# Patient Record
Sex: Female | Born: 1935 | Race: White | Hispanic: No | Smoking: Former smoker
Health system: Southern US, Community
[De-identification: ages and names within clinical notes are randomized; demographics above are authoritative.]

## PROBLEM LIST (undated history)

## (undated) DIAGNOSIS — I10 Essential (primary) hypertension: Secondary | ICD-10-CM

## (undated) DIAGNOSIS — E538 Deficiency of other specified B group vitamins: Secondary | ICD-10-CM

## (undated) DIAGNOSIS — J449 Chronic obstructive pulmonary disease, unspecified: Secondary | ICD-10-CM

## (undated) DIAGNOSIS — R7303 Prediabetes: Secondary | ICD-10-CM

## (undated) HISTORY — PX: ANKLE FRACTURE SURGERY: SHX122

---

## 2018-02-18 ENCOUNTER — Inpatient Hospital Stay (HOSPITAL_COMMUNITY): Payer: Medicare Other

## 2018-02-18 ENCOUNTER — Emergency Department (HOSPITAL_COMMUNITY): Payer: Medicare Other

## 2018-02-18 ENCOUNTER — Inpatient Hospital Stay (HOSPITAL_COMMUNITY)
Admission: EM | Admit: 2018-02-18 | Discharge: 2018-02-25 | DRG: 493 | Disposition: A | Discharge status: Skilled Nursing Facility | Payer: Medicare Other | Attending: Internal Medicine | Admitting: Internal Medicine

## 2018-02-18 ENCOUNTER — Inpatient Hospital Stay (HOSPITAL_COMMUNITY): Payer: Medicare Other | Admitting: Anesthesiology

## 2018-02-18 ENCOUNTER — Encounter (HOSPITAL_COMMUNITY)
Admission: EM | Disposition: A | Discharge status: Skilled Nursing Facility | Payer: Self-pay | Source: Home / Self Care | Attending: Internal Medicine

## 2018-02-18 ENCOUNTER — Encounter (HOSPITAL_COMMUNITY): Payer: Self-pay | Admitting: Emergency Medicine

## 2018-02-18 DIAGNOSIS — Y92018 Other place in single-family (private) house as the place of occurrence of the external cause: Secondary | ICD-10-CM | POA: Diagnosis not present

## 2018-02-18 DIAGNOSIS — T148XXA Other injury of unspecified body region, initial encounter: Secondary | ICD-10-CM

## 2018-02-18 DIAGNOSIS — Z7982 Long term (current) use of aspirin: Secondary | ICD-10-CM

## 2018-02-18 DIAGNOSIS — L03113 Cellulitis of right upper limb: Secondary | ICD-10-CM | POA: Diagnosis present

## 2018-02-18 DIAGNOSIS — E538 Deficiency of other specified B group vitamins: Secondary | ICD-10-CM | POA: Diagnosis present

## 2018-02-18 DIAGNOSIS — Z888 Allergy status to other drugs, medicaments and biological substances status: Secondary | ICD-10-CM

## 2018-02-18 DIAGNOSIS — Z7951 Long term (current) use of inhaled steroids: Secondary | ICD-10-CM

## 2018-02-18 DIAGNOSIS — S82892A Other fracture of left lower leg, initial encounter for closed fracture: Secondary | ICD-10-CM

## 2018-02-18 DIAGNOSIS — S82892B Other fracture of left lower leg, initial encounter for open fracture type I or II: Secondary | ICD-10-CM | POA: Diagnosis not present

## 2018-02-18 DIAGNOSIS — Z87891 Personal history of nicotine dependence: Secondary | ICD-10-CM | POA: Diagnosis not present

## 2018-02-18 DIAGNOSIS — W19XXXA Unspecified fall, initial encounter: Secondary | ICD-10-CM

## 2018-02-18 DIAGNOSIS — S82899A Other fracture of unspecified lower leg, initial encounter for closed fracture: Secondary | ICD-10-CM | POA: Diagnosis present

## 2018-02-18 DIAGNOSIS — F419 Anxiety disorder, unspecified: Secondary | ICD-10-CM | POA: Diagnosis present

## 2018-02-18 DIAGNOSIS — I129 Hypertensive chronic kidney disease with stage 1 through stage 4 chronic kidney disease, or unspecified chronic kidney disease: Secondary | ICD-10-CM | POA: Diagnosis present

## 2018-02-18 DIAGNOSIS — W5501XA Bitten by cat, initial encounter: Secondary | ICD-10-CM | POA: Diagnosis not present

## 2018-02-18 DIAGNOSIS — S82892E Other fracture of left lower leg, subsequent encounter for open fracture type I or II with routine healing: Secondary | ICD-10-CM | POA: Diagnosis present

## 2018-02-18 DIAGNOSIS — N189 Chronic kidney disease, unspecified: Secondary | ICD-10-CM | POA: Diagnosis present

## 2018-02-18 DIAGNOSIS — D631 Anemia in chronic kidney disease: Secondary | ICD-10-CM | POA: Diagnosis present

## 2018-02-18 DIAGNOSIS — W109XXA Fall (on) (from) unspecified stairs and steps, initial encounter: Secondary | ICD-10-CM | POA: Diagnosis present

## 2018-02-18 DIAGNOSIS — L0390 Cellulitis, unspecified: Secondary | ICD-10-CM | POA: Diagnosis present

## 2018-02-18 DIAGNOSIS — R7303 Prediabetes: Secondary | ICD-10-CM | POA: Diagnosis present

## 2018-02-18 DIAGNOSIS — Z9981 Dependence on supplemental oxygen: Secondary | ICD-10-CM | POA: Diagnosis not present

## 2018-02-18 DIAGNOSIS — Z91048 Other nonmedicinal substance allergy status: Secondary | ICD-10-CM

## 2018-02-18 DIAGNOSIS — S82252B Displaced comminuted fracture of shaft of left tibia, initial encounter for open fracture type I or II: Principal | ICD-10-CM | POA: Diagnosis present

## 2018-02-18 DIAGNOSIS — J449 Chronic obstructive pulmonary disease, unspecified: Secondary | ICD-10-CM | POA: Diagnosis present

## 2018-02-18 DIAGNOSIS — I10 Essential (primary) hypertension: Secondary | ICD-10-CM | POA: Diagnosis present

## 2018-02-18 DIAGNOSIS — E785 Hyperlipidemia, unspecified: Secondary | ICD-10-CM | POA: Diagnosis present

## 2018-02-18 DIAGNOSIS — N19 Unspecified kidney failure: Secondary | ICD-10-CM | POA: Diagnosis present

## 2018-02-18 DIAGNOSIS — S82209A Unspecified fracture of shaft of unspecified tibia, initial encounter for closed fracture: Secondary | ICD-10-CM | POA: Diagnosis present

## 2018-02-18 DIAGNOSIS — A280 Pasteurellosis: Secondary | ICD-10-CM | POA: Diagnosis present

## 2018-02-18 DIAGNOSIS — L03011 Cellulitis of right finger: Secondary | ICD-10-CM

## 2018-02-18 HISTORY — DX: Chronic obstructive pulmonary disease, unspecified: J44.9

## 2018-02-18 HISTORY — DX: Essential (primary) hypertension: I10

## 2018-02-18 HISTORY — PX: EXTERNAL FIXATION LEG: SHX1549

## 2018-02-18 HISTORY — PX: APPLICATION OF WOUND VAC: SHX5189

## 2018-02-18 HISTORY — PX: INCISION AND DRAINAGE OF WOUND: SHX1803

## 2018-02-18 HISTORY — DX: Prediabetes: R73.03

## 2018-02-18 HISTORY — PX: I & D EXTREMITY: SHX5045

## 2018-02-18 HISTORY — DX: Deficiency of other specified B group vitamins: E53.8

## 2018-02-18 LAB — CBC WITH DIFFERENTIAL/PLATELET
Basophils Absolute: 0 10*3/uL (ref 0.0–0.1)
Basophils Relative: 0 %
EOS PCT: 0 %
Eosinophils Absolute: 0 10*3/uL (ref 0.0–0.7)
HCT: 36 % (ref 36.0–46.0)
Hemoglobin: 11.5 g/dL — ABNORMAL LOW (ref 12.0–15.0)
LYMPHS ABS: 1.3 10*3/uL (ref 0.7–4.0)
Lymphocytes Relative: 5 %
MCH: 30 pg (ref 26.0–34.0)
MCHC: 31.9 g/dL (ref 30.0–36.0)
MCV: 94 fL (ref 78.0–100.0)
MONO ABS: 2.1 10*3/uL — AB (ref 0.1–1.0)
MONOS PCT: 8 %
Neutro Abs: 22.7 10*3/uL — ABNORMAL HIGH (ref 1.7–7.7)
Neutrophils Relative %: 87 %
PLATELETS: 143 10*3/uL — AB (ref 150–400)
RBC: 3.83 MIL/uL — AB (ref 3.87–5.11)
RDW: 13.9 % (ref 11.5–15.5)
WBC: 26.1 10*3/uL — AB (ref 4.0–10.5)

## 2018-02-18 LAB — BASIC METABOLIC PANEL
ANION GAP: 10 (ref 5–15)
BUN: 40 mg/dL — AB (ref 8–23)
CALCIUM: 8.8 mg/dL — AB (ref 8.9–10.3)
CO2: 25 mmol/L (ref 22–32)
CREATININE: 1.72 mg/dL — AB (ref 0.44–1.00)
Chloride: 107 mmol/L (ref 98–111)
GFR calc non Af Amer: 26 mL/min — ABNORMAL LOW (ref >60)
GFR, EST AFRICAN AMERICAN: 30 mL/min — AB (ref >60)
Glucose, Bld: 121 mg/dL — ABNORMAL HIGH (ref 70–99)
Potassium: 4 mmol/L (ref 3.5–5.1)
Sodium: 142 mmol/L (ref 135–145)

## 2018-02-18 LAB — GLUCOSE, CAPILLARY: GLUCOSE-CAPILLARY: 152 mg/dL — AB (ref 70–99)

## 2018-02-18 SURGERY — IRRIGATION AND DEBRIDEMENT EXTREMITY
Anesthesia: General | Site: Hand | Laterality: Right

## 2018-02-18 MED ORDER — DOCUSATE SODIUM 100 MG PO CAPS: 100.0000 mg | ORAL_CAPSULE | Freq: Two times a day (BID) | ORAL | Status: DC

## 2018-02-18 MED ORDER — SODIUM CHLORIDE 0.9 % IV SOLN
3.0000 g | INTRAVENOUS | Status: AC
  Administered 2018-02-18: 3 g via INTRAVENOUS
  Filled 2018-02-18: qty 3

## 2018-02-18 MED ORDER — ENOXAPARIN SODIUM 40 MG/0.4ML ~~LOC~~ SOLN: 40.0000 mg | SUBCUTANEOUS | Status: DC

## 2018-02-18 MED ORDER — PANTOPRAZOLE SODIUM 40 MG PO TBEC
40.0000 mg | DELAYED_RELEASE_TABLET | Freq: Every day | ORAL | Status: DC
  Administered 2018-02-19 – 2018-02-25 (×6): 40 mg via ORAL
  Filled 2018-02-18 (×6): qty 1

## 2018-02-18 MED ORDER — ROCURONIUM BROMIDE 50 MG/5ML IV SOSY
PREFILLED_SYRINGE | INTRAVENOUS | Status: AC
  Filled 2018-02-18: qty 5

## 2018-02-18 MED ORDER — CHLORHEXIDINE GLUCONATE 4 % EX LIQD
60.0000 mL | Freq: Once | CUTANEOUS | Status: DC
  Filled 2018-02-18: qty 60

## 2018-02-18 MED ORDER — DEXAMETHASONE SODIUM PHOSPHATE 10 MG/ML IJ SOLN
INTRAMUSCULAR | Status: AC
  Filled 2018-02-18: qty 1

## 2018-02-18 MED ORDER — METOCLOPRAMIDE HCL 5 MG PO TABS: 5.0000 mg | ORAL_TABLET | Freq: Three times a day (TID) | ORAL | Status: DC | PRN

## 2018-02-18 MED ORDER — DOCUSATE SODIUM 100 MG PO CAPS
100.0000 mg | ORAL_CAPSULE | Freq: Two times a day (BID) | ORAL | Status: DC
  Administered 2018-02-18 – 2018-02-25 (×13): 100 mg via ORAL
  Filled 2018-02-18 (×13): qty 1

## 2018-02-18 MED ORDER — ALPRAZOLAM 0.5 MG PO TABS
0.5000 mg | ORAL_TABLET | Freq: Three times a day (TID) | ORAL | Status: DC | PRN
  Administered 2018-02-20: 0.5 mg via ORAL
  Filled 2018-02-18: qty 2

## 2018-02-18 MED ORDER — PROPOFOL 10 MG/ML IV BOLUS
INTRAVENOUS | Status: DC | PRN
  Administered 2018-02-18: 100 mg via INTRAVENOUS

## 2018-02-18 MED ORDER — METOCLOPRAMIDE HCL 5 MG/ML IJ SOLN: 5.0000 mg | Freq: Three times a day (TID) | INTRAMUSCULAR | Status: DC | PRN

## 2018-02-18 MED ORDER — HEPARIN SODIUM (PORCINE) 5000 UNIT/ML IJ SOLN
5000.0000 [IU] | Freq: Three times a day (TID) | INTRAMUSCULAR | Status: DC
  Administered 2018-02-19: 5000 [IU] via SUBCUTANEOUS
  Filled 2018-02-18: qty 1

## 2018-02-18 MED ORDER — ROCURONIUM BROMIDE 50 MG/5ML IV SOSY
PREFILLED_SYRINGE | INTRAVENOUS | Status: DC | PRN
  Administered 2018-02-18: 10 mg via INTRAVENOUS
  Administered 2018-02-18: 40 mg via INTRAVENOUS

## 2018-02-18 MED ORDER — FENTANYL CITRATE (PF) 100 MCG/2ML IJ SOLN
INTRAMUSCULAR | Status: AC
  Administered 2018-02-18: 25 ug via INTRAVENOUS
  Filled 2018-02-18: qty 2

## 2018-02-18 MED ORDER — INSULIN ASPART 100 UNIT/ML ~~LOC~~ SOLN
0.0000 [IU] | Freq: Three times a day (TID) | SUBCUTANEOUS | Status: DC
  Administered 2018-02-19 (×2): 1 [IU] via SUBCUTANEOUS
  Administered 2018-02-19: 2 [IU] via SUBCUTANEOUS
  Administered 2018-02-20: 3 [IU] via SUBCUTANEOUS

## 2018-02-18 MED ORDER — PROPOFOL 10 MG/ML IV BOLUS
10.0000 mg | Freq: Once | INTRAVENOUS | Status: AC
  Administered 2018-02-18: 40 mg via INTRAVENOUS
  Filled 2018-02-18: qty 20

## 2018-02-18 MED ORDER — HYDROCODONE-ACETAMINOPHEN 5-325 MG PO TABS
1.0000 | ORAL_TABLET | Freq: Four times a day (QID) | ORAL | Status: DC | PRN
  Administered 2018-02-19 (×3): 1 via ORAL
  Filled 2018-02-18 (×3): qty 1

## 2018-02-18 MED ORDER — ALBUTEROL SULFATE (2.5 MG/3ML) 0.083% IN NEBU: 2.5000 mg | INHALATION_SOLUTION | RESPIRATORY_TRACT | Status: DC | PRN

## 2018-02-18 MED ORDER — EPHEDRINE SULFATE-NACL 50-0.9 MG/10ML-% IV SOSY
PREFILLED_SYRINGE | INTRAVENOUS | Status: DC | PRN
  Administered 2018-02-18: 5 mg via INTRAVENOUS
  Administered 2018-02-18: 10 mg via INTRAVENOUS

## 2018-02-18 MED ORDER — FLUTICASONE FUROATE-VILANTEROL 100-25 MCG/INH IN AEPB
1.0000 | INHALATION_SPRAY | Freq: Every day | RESPIRATORY_TRACT | Status: DC
  Administered 2018-02-19 – 2018-02-25 (×6): 1 via RESPIRATORY_TRACT
  Filled 2018-02-18: qty 28

## 2018-02-18 MED ORDER — EPHEDRINE 5 MG/ML INJ
INTRAVENOUS | Status: AC
  Filled 2018-02-18: qty 10

## 2018-02-18 MED ORDER — FENTANYL CITRATE (PF) 250 MCG/5ML IJ SOLN
INTRAMUSCULAR | Status: AC
  Filled 2018-02-18: qty 5

## 2018-02-18 MED ORDER — SODIUM CHLORIDE 0.9 % IV SOLN
1.5000 g | Freq: Three times a day (TID) | INTRAVENOUS | Status: DC
  Administered 2018-02-19 – 2018-02-22 (×10): 1.5 g via INTRAVENOUS
  Filled 2018-02-18 (×11): qty 1.5

## 2018-02-18 MED ORDER — PROMETHAZINE HCL 25 MG/ML IJ SOLN: 6.2500 mg | INTRAMUSCULAR | Status: DC | PRN

## 2018-02-18 MED ORDER — SODIUM CHLORIDE 0.9 % IV SOLN
INTRAVENOUS | Status: DC | PRN
  Administered 2018-02-18: 40 ug/min via INTRAVENOUS

## 2018-02-18 MED ORDER — POVIDONE-IODINE 10 % EX SWAB: 2.0000 "application " | Freq: Once | CUTANEOUS | Status: DC

## 2018-02-18 MED ORDER — UMECLIDINIUM BROMIDE 62.5 MCG/INH IN AEPB
1.0000 | INHALATION_SPRAY | Freq: Every day | RESPIRATORY_TRACT | Status: DC
  Administered 2018-02-19 – 2018-02-25 (×6): 1 via RESPIRATORY_TRACT
  Filled 2018-02-18: qty 7

## 2018-02-18 MED ORDER — MORPHINE SULFATE (PF) 2 MG/ML IV SOLN: 0.5000 mg | INTRAVENOUS | Status: DC | PRN

## 2018-02-18 MED ORDER — PHENYLEPHRINE 40 MCG/ML (10ML) SYRINGE FOR IV PUSH (FOR BLOOD PRESSURE SUPPORT)
PREFILLED_SYRINGE | INTRAVENOUS | Status: AC
  Filled 2018-02-18: qty 10

## 2018-02-18 MED ORDER — SUGAMMADEX SODIUM 200 MG/2ML IV SOLN
INTRAVENOUS | Status: DC | PRN
  Administered 2018-02-18: 200 mg via INTRAVENOUS

## 2018-02-18 MED ORDER — ASPIRIN EC 81 MG PO TBEC: 81.0000 mg | DELAYED_RELEASE_TABLET | Freq: Every day | ORAL | Status: DC

## 2018-02-18 MED ORDER — DEXAMETHASONE SODIUM PHOSPHATE 10 MG/ML IJ SOLN
INTRAMUSCULAR | Status: DC | PRN
  Administered 2018-02-18: 5 mg via INTRAVENOUS

## 2018-02-18 MED ORDER — VANCOMYCIN HCL 500 MG IV SOLR
INTRAVENOUS | Status: AC
  Filled 2018-02-18: qty 500

## 2018-02-18 MED ORDER — ONDANSETRON HCL 4 MG/2ML IJ SOLN
INTRAMUSCULAR | Status: DC | PRN
  Administered 2018-02-18: 4 mg via INTRAVENOUS

## 2018-02-18 MED ORDER — ONDANSETRON HCL 4 MG/2ML IJ SOLN
INTRAMUSCULAR | Status: AC
  Filled 2018-02-18: qty 2

## 2018-02-18 MED ORDER — SODIUM CHLORIDE 0.9 % IV SOLN
Freq: Once | INTRAVENOUS | Status: AC
  Administered 2018-02-18: 20:00:00 via INTRAVENOUS

## 2018-02-18 MED ORDER — METHOCARBAMOL 1000 MG/10ML IJ SOLN
500.0000 mg | Freq: Four times a day (QID) | INTRAVENOUS | Status: DC | PRN
  Filled 2018-02-18: qty 5

## 2018-02-18 MED ORDER — METOPROLOL SUCCINATE ER 50 MG PO TB24
50.0000 mg | ORAL_TABLET | Freq: Every day | ORAL | Status: DC
  Administered 2018-02-19 – 2018-02-25 (×6): 50 mg via ORAL
  Filled 2018-02-18 (×7): qty 1

## 2018-02-18 MED ORDER — FENTANYL CITRATE (PF) 100 MCG/2ML IJ SOLN
50.0000 ug | INTRAMUSCULAR | Status: DC | PRN
  Administered 2018-02-18: 50 ug via INTRAVENOUS
  Administered 2018-02-18: 25 ug via INTRAVENOUS
  Administered 2018-02-18: 50 ug via INTRAVENOUS
  Administered 2018-02-18: 25 ug via INTRAVENOUS
  Administered 2018-02-18: 50 ug via INTRAVENOUS
  Administered 2018-02-18: 100 ug via INTRAVENOUS
  Filled 2018-02-18 (×4): qty 2

## 2018-02-18 MED ORDER — ONDANSETRON HCL 4 MG/2ML IJ SOLN
4.0000 mg | Freq: Four times a day (QID) | INTRAMUSCULAR | Status: DC | PRN
  Administered 2018-02-20 – 2018-02-22 (×2): 4 mg via INTRAVENOUS
  Filled 2018-02-18 (×2): qty 2

## 2018-02-18 MED ORDER — FENTANYL CITRATE (PF) 100 MCG/2ML IJ SOLN
25.0000 ug | INTRAMUSCULAR | Status: DC | PRN
  Administered 2018-02-18: 25 ug via INTRAVENOUS
  Administered 2018-02-18: 50 ug via INTRAVENOUS
  Administered 2018-02-18: 25 ug via INTRAVENOUS

## 2018-02-18 MED ORDER — LACTATED RINGERS IV SOLN
INTRAVENOUS | Status: DC
  Administered 2018-02-18 – 2018-02-20 (×2): via INTRAVENOUS

## 2018-02-18 MED ORDER — VANCOMYCIN HCL 500 MG IV SOLR
INTRAVENOUS | Status: DC | PRN
  Administered 2018-02-18: 500 mg via TOPICAL

## 2018-02-18 MED ORDER — LIDOCAINE 2% (20 MG/ML) 5 ML SYRINGE
INTRAMUSCULAR | Status: AC
  Filled 2018-02-18: qty 5

## 2018-02-18 MED ORDER — CLONIDINE HCL 0.2 MG PO TABS
0.2000 mg | ORAL_TABLET | Freq: Two times a day (BID) | ORAL | Status: DC
  Administered 2018-02-18 – 2018-02-25 (×12): 0.2 mg via ORAL
  Filled 2018-02-18 (×13): qty 1

## 2018-02-18 MED ORDER — LIDOCAINE 2% (20 MG/ML) 5 ML SYRINGE
INTRAMUSCULAR | Status: DC | PRN
  Administered 2018-02-18: 100 mg via INTRAVENOUS

## 2018-02-18 MED ORDER — SODIUM CHLORIDE 0.9 % IR SOLN
Status: DC | PRN
  Administered 2018-02-18 (×4): 1000 mL
  Administered 2018-02-18: 3000 mL

## 2018-02-18 MED ORDER — ONDANSETRON HCL 4 MG PO TABS: 4.0000 mg | ORAL_TABLET | Freq: Four times a day (QID) | ORAL | Status: DC | PRN

## 2018-02-18 MED ORDER — PROPOFOL 10 MG/ML IV BOLUS
INTRAVENOUS | Status: AC
  Filled 2018-02-18: qty 20

## 2018-02-18 MED ORDER — AMLODIPINE BESYLATE 10 MG PO TABS
10.0000 mg | ORAL_TABLET | Freq: Every day | ORAL | Status: DC
  Administered 2018-02-18 – 2018-02-25 (×6): 10 mg via ORAL
  Filled 2018-02-18 (×3): qty 1
  Filled 2018-02-18: qty 2
  Filled 2018-02-18 (×2): qty 1
  Filled 2018-02-18: qty 2

## 2018-02-18 MED ORDER — PHENYLEPHRINE 40 MCG/ML (10ML) SYRINGE FOR IV PUSH (FOR BLOOD PRESSURE SUPPORT)
PREFILLED_SYRINGE | INTRAVENOUS | Status: DC | PRN
  Administered 2018-02-18: 40 ug via INTRAVENOUS

## 2018-02-18 MED ORDER — CEFAZOLIN SODIUM-DEXTROSE 1-4 GM/50ML-% IV SOLN
1.0000 g | Freq: Once | INTRAVENOUS | Status: AC
  Administered 2018-02-18: 1 g via INTRAVENOUS
  Filled 2018-02-18 (×2): qty 50

## 2018-02-18 MED ORDER — MEPERIDINE HCL 50 MG/ML IJ SOLN: 6.2500 mg | INTRAMUSCULAR | Status: DC | PRN

## 2018-02-18 MED ORDER — FLUTICASONE-UMECLIDIN-VILANT 100-62.5-25 MCG/INH IN AEPB: 1.0000 | INHALATION_SPRAY | Freq: Every day | RESPIRATORY_TRACT | Status: DC

## 2018-02-18 MED ORDER — METHOCARBAMOL 500 MG PO TABS
500.0000 mg | ORAL_TABLET | Freq: Four times a day (QID) | ORAL | Status: DC | PRN
  Administered 2018-02-19 – 2018-02-20 (×2): 500 mg via ORAL
  Filled 2018-02-18: qty 1

## 2018-02-18 SURGICAL SUPPLY — 83 items
ALCOHOL 70% 16 OZ (MISCELLANEOUS) ×4 IMPLANT
BANDAGE ACE 4X5 VEL STRL LF (GAUZE/BANDAGES/DRESSINGS) ×8 IMPLANT
BANDAGE ACE 6X5 VEL STRL LF (GAUZE/BANDAGES/DRESSINGS) ×4 IMPLANT
BANDAGE ESMARK 6X9 LF (GAUZE/BANDAGES/DRESSINGS) ×2 IMPLANT
BAR GLASS FIBER EXFX 11X300 (EXFIX) ×4 IMPLANT
BNDG COHESIVE 4X5 TAN STRL (GAUZE/BANDAGES/DRESSINGS) IMPLANT
BNDG COHESIVE 6X5 TAN STRL LF (GAUZE/BANDAGES/DRESSINGS) ×4 IMPLANT
BNDG ESMARK 6X9 LF (GAUZE/BANDAGES/DRESSINGS) ×4
BNDG GAUZE ELAST 4 BULKY (GAUZE/BANDAGES/DRESSINGS) ×4 IMPLANT
BNDG STRETCH 4X75 NS LF (GAUZE/BANDAGES/DRESSINGS) ×4 IMPLANT
CANISTER WOUND CARE 500ML ATS (WOUND CARE) ×4 IMPLANT
CAP PROTECTIVE TRANSFX 4.5X5MM (EXFIX) ×16 IMPLANT
CLAMP BLUE BAR TO PIN (EXFIX) ×8 IMPLANT
CLEANER TIP ELECTROSURG 2X2 (MISCELLANEOUS) ×4 IMPLANT
CLOSURE WOUND 1/2 X4 (GAUZE/BANDAGES/DRESSINGS)
COVER SURGICAL LIGHT HANDLE (MISCELLANEOUS) ×4 IMPLANT
DRAIN PENROSE 1/4X12 LTX STRL (WOUND CARE) ×4 IMPLANT
DRAPE C-ARM 42X72 X-RAY (DRAPES) IMPLANT
DRAPE C-ARMOR (DRAPES) ×4 IMPLANT
DRAPE EXTREMITY T 121X128X90 (DRAPE) ×4 IMPLANT
DRAPE IMP U-DRAPE 54X76 (DRAPES) ×4 IMPLANT
DRAPE LAPAROTOMY TRNSV 102X78 (DRAPE) ×4 IMPLANT
DRAPE OEC MINIVIEW 54X84 (DRAPES) ×4 IMPLANT
DRAPE U-SHAPE 47X51 STRL (DRAPES) ×4 IMPLANT
DRSG ADAPTIC 3X8 NADH LF (GAUZE/BANDAGES/DRESSINGS) ×4 IMPLANT
DRSG PAD ABDOMINAL 8X10 ST (GAUZE/BANDAGES/DRESSINGS) IMPLANT
DURAPREP 26ML APPLICATOR (WOUND CARE) ×4 IMPLANT
ELECT REM PT RETURN 9FT ADLT (ELECTROSURGICAL) ×4
ELECTRODE REM PT RTRN 9FT ADLT (ELECTROSURGICAL) ×2 IMPLANT
EVACUATOR 1/8 PVC DRAIN (DRAIN) IMPLANT
GAUZE SPONGE 4X4 12PLY STRL (GAUZE/BANDAGES/DRESSINGS) ×4 IMPLANT
GAUZE SPONGE 4X4 12PLY STRL LF (GAUZE/BANDAGES/DRESSINGS) ×8 IMPLANT
GAUZE XEROFORM 5X9 LF (GAUZE/BANDAGES/DRESSINGS) ×8 IMPLANT
GLOVE BIOGEL PI IND STRL 7.5 (GLOVE) ×2 IMPLANT
GLOVE BIOGEL PI IND STRL 8 (GLOVE) ×2 IMPLANT
GLOVE BIOGEL PI INDICATOR 7.5 (GLOVE) ×2
GLOVE BIOGEL PI INDICATOR 8 (GLOVE) ×2
GLOVE ECLIPSE 7.0 STRL STRAW (GLOVE) ×4 IMPLANT
GLOVE SURG ORTHO 8.0 STRL STRW (GLOVE) ×4 IMPLANT
GOWN STRL REUS W/ TWL LRG LVL3 (GOWN DISPOSABLE) ×6 IMPLANT
GOWN STRL REUS W/ TWL XL LVL3 (GOWN DISPOSABLE) ×2 IMPLANT
GOWN STRL REUS W/TWL LRG LVL3 (GOWN DISPOSABLE) ×6
GOWN STRL REUS W/TWL XL LVL3 (GOWN DISPOSABLE) ×2
HANDPIECE INTERPULSE COAX TIP (DISPOSABLE)
KIT BASIN OR (CUSTOM PROCEDURE TRAY) ×4 IMPLANT
KIT PREVENA INCISION MGT 13 (CANNISTER) ×4 IMPLANT
KIT TURNOVER KIT B (KITS) ×4 IMPLANT
MANIFOLD NEPTUNE II (INSTRUMENTS) ×4 IMPLANT
NEEDLE 22X1 1/2 (OR ONLY) (NEEDLE) IMPLANT
NS IRRIG 1000ML POUR BTL (IV SOLUTION) ×4 IMPLANT
PACK ORTHO EXTREMITY (CUSTOM PROCEDURE TRAY) ×4 IMPLANT
PAD ARMBOARD 7.5X6 YLW CONV (MISCELLANEOUS) ×4 IMPLANT
PAD CAST 4YDX4 CTTN HI CHSV (CAST SUPPLIES) IMPLANT
PADDING CAST COTTON 4X4 STRL (CAST SUPPLIES)
PADDING CAST COTTON 6X4 STRL (CAST SUPPLIES) ×12 IMPLANT
PIN CLAMP 2BAR 75MM BLUE (EXFIX) ×4 IMPLANT
PIN HALF YELLOW 5X160X35 (EXFIX) ×8 IMPLANT
PIN TRANSFIXING 5.0 (EXFIX) ×4 IMPLANT
SET HNDPC FAN SPRY TIP SCT (DISPOSABLE) IMPLANT
SPONGE LAP 18X18 X RAY DECT (DISPOSABLE) ×4 IMPLANT
SPONGE LAP 4X18 RFD (DISPOSABLE) IMPLANT
STAPLER VISISTAT 35W (STAPLE) IMPLANT
STOCKINETTE IMPERVIOUS 9X36 MD (GAUZE/BANDAGES/DRESSINGS) IMPLANT
STOCKINETTE IMPERVIOUS LG (DRAPES) ×4 IMPLANT
STRIP CLOSURE SKIN 1/2X4 (GAUZE/BANDAGES/DRESSINGS) IMPLANT
SUCTION FRAZIER HANDLE 10FR (MISCELLANEOUS)
SUCTION TUBE FRAZIER 10FR DISP (MISCELLANEOUS) IMPLANT
SUT ETHILON 2 0 FS 18 (SUTURE) ×12 IMPLANT
SUT ETHILON 3 0 PS 1 (SUTURE) ×12 IMPLANT
SUT VIC AB 0 CT1 27 (SUTURE) ×4
SUT VIC AB 0 CT1 27XBRD ANBCTR (SUTURE) ×4 IMPLANT
SUT VIC AB 2-0 CT1 27 (SUTURE) ×4
SUT VIC AB 2-0 CT1 TAPERPNT 27 (SUTURE) ×4 IMPLANT
SWAB CULTURE ESWAB REG 1ML (MISCELLANEOUS) IMPLANT
SWAB CULTURE LIQ STUART DBL (MISCELLANEOUS) IMPLANT
SYR CONTROL 10ML LL (SYRINGE) IMPLANT
TOWEL OR 17X24 6PK STRL BLUE (TOWEL DISPOSABLE) ×8 IMPLANT
TOWEL OR 17X26 10 PK STRL BLUE (TOWEL DISPOSABLE) ×4 IMPLANT
TUBE CONNECTING 12'X1/4 (SUCTIONS) ×1
TUBE CONNECTING 12X1/4 (SUCTIONS) ×3 IMPLANT
UNDERPAD 30X30 (UNDERPADS AND DIAPERS) ×4 IMPLANT
WATER STERILE IRR 1000ML POUR (IV SOLUTION) ×8 IMPLANT
YANKAUER SUCT BULB TIP NO VENT (SUCTIONS) ×4 IMPLANT

## 2018-02-18 NOTE — Progress Notes (Signed)
Cat bite to right hand a month ago has become more symptomatic over the past 3 days.  I will ask hand surgery to evaluate.  Patient has been on antibiotics but her hand swelling has increased.  Current plan for today is operative washout of left ankle with external fixation

## 2018-02-18 NOTE — Brief Op Note (Signed)
02/18/2018  7:00 PM  PATIENT:  Charleston RopesVirgina Bozarth  84 y.o. female  PRE-OPERATIVE DIAGNOSIS:  Open left ankle fx  POST-OPERATIVE DIAGNOSIS:  Open left ankle fx  PROCEDURE:  Procedure(s): IRRIGATION AND DEBRIDEMENT EXTREMITY EXTERNAL FIXATION LEG APPLICATION OF WOUND VAC IRRIGATION AND DEBRIDEMENT WOUND  SURGEON:  Surgeon(s): August Saucerean, Corrie MckusickGregory Scott, MD Sheral ApleyMurphy, Timothy D, MD  ASSISTANT: none  ANESTHESIA:   general  EBL: 25 ml    Total I/O In: 50 [IV Piggyback:50] Out: 25 [Blood:25]  BLOOD ADMINISTERED: none  DRAINS: wound incisional vac   LOCAL MEDICATIONS USED:  none  SPECIMEN:  No Specimen  COUNTS:  YES  TOURNIQUET:  * Missing tourniquet times found for documented tourniquets in log: [id number] *  DICTATION: .Other Dictation: Dictation Number [id number]  PLAN OF CARE: Admit to inpatient   PATIENT DISPOSITION:  PACU - hemodynamically stable

## 2018-02-18 NOTE — Op Note (Signed)
02/18/2018  6:45 PM  PATIENT:  Ashley Mora    PRE-OPERATIVE DIAGNOSIS:  Open left ankle fx  POST-OPERATIVE DIAGNOSIS:  Same  PROCEDURE:  IRRIGATION AND DEBRIDEMENT EXTREMITY, EXTERNAL FIXATION LEG, APPLICATION OF WOUND VAC, IRRIGATION AND DEBRIDEMENT WOUND  SURGEON:  Sheral Apleyimothy D Zakariah Dejarnette, MD  ASSISTANT: none  ANESTHESIA:   gen  PREOPERATIVE INDICATIONS:  Ashley Mora is a  84 y.o. female with a diagnosis of Open left ankle fx who failed conservative measures and elected for surgical management.    The risks benefits and alternatives were discussed with the patient preoperatively including but not limited to the risks of infection, bleeding, nerve injury, cardiopulmonary complications, the need for revision surgery, among others, and the patient was willing to proceed.  OPERATIVE IMPLANTS: penrose drain  OPERATIVE FINDINGS: fluid pocket on dorsum of hand  BLOOD LOSS: 15cc  COMPLICATIONS: none  TOURNIQUET TIME: none  OPERATIVE PROCEDURE:  Patient was identified in the preoperative holding area and site was marked by me She was transported to the operating theater and placed on the table in supine position taking care to pad all bony prominences. After a preincinduction time out anesthesia was induced. The Right upper extremity was prepped and draped in normal sterile fashion and a pre-incision timeout was performed. She received ancef/unasyn for preoperative antibiotics.   Please see Dr. Diamantina Providenceean's note for ankle portion of this procedure.  I obtained consent from her daughter she was already under anesthesia for her open ankle fracture.  She had a dorsal hand infection from a cat bite.  I extended this wound proximally and distally roughly 4 cm.  She had a pocket of fluid collection with some purulent fluid dorsally I sent cultures.  I performed an excisional debridement of devitalized deep tissue fascia muscle using scissors.  I performed a thorough irrigation.  I placed a  Penrose drain and loosely closed her skin.  Sterile dressing was applied.  Remainder of the case was turned over again to Dr. August Saucerean.  POST OPERATIVE PLAN: [facility] px [facility]

## 2018-02-18 NOTE — ED Triage Notes (Addendum)
[facility] from [facility], with open tib/fib fx dislocation from stumbling down steps going into house.  Pt received Fentanyl 150mcg iv, Cefazolin 1 Gm IVPB, Zofran 4mg  IV,  Pt received tetanus booster this am while at dr's office getting right hand checked - has cellulitis from cat bite. Swollen area marked and dated.  Open wound/obvious dislocation to left ankle. Positive pedal  Pulse with good cap refill.  Pt is on O2 at 3L//[place] continuously at home.

## 2018-02-18 NOTE — Progress Notes (Signed)
Pharmacy Antibiotic Note  [name] is a 84 y.o. female admitted on 02/18/2018 with wound infection.  Pharmacy has been consulted for Unasyn dosing.  [place] a recent [place] bite but the wound got worse a few days ago. She was taken to the OR today for an I&D of the infection. She got a dose of Unasyn in the OR. Plan is to cover her with it post I&D.  Scr 1.72, age 84. We don't have a documented wt yet but she should fall into the q8 dosing. We will adjust dose in AM as needed.   Plan:  Unasyn 1.5g IV q8 F/u with wt in AM for CrCl    Temp (24hrs), Avg:98.1 F (36.7 C), Min:97.9 F (36.6 C), Max:98.2 F (36.8 C)  Recent Labs  Lab 02/18/18 1404  WBC 26.1*  CREATININE 1.72*    CrCl cannot be calculated (Unknown ideal weight.).    Allergies  Allergen Reactions  . Atorvastatin Other (See Comments)    Made arms very sore  . Colchicine Other (See Comments)  . Tape Other (See Comments)    Patient bruises VERY easily and skin is FRAGILE!!    Antimicrobials this admission: 8/29 unasyn >>    Dose adjustments this admission:  Microbiology results:  Ulyses SouthwardMinh Pham, PharmD, BCIDP, AAHIVP, CPP Infectious Disease Pharmacist Pager: [phone] 02/18/2018 7:24 PM

## 2018-02-18 NOTE — Consult Note (Signed)
ORTHOPAEDIC CONSULTATION  REQUESTING PHYSICIAN: Karmen Bongo, MD  Chief Complaint: right hand infection  HPI: Ashley Mora is a 84 y.o. female who complains of  A cat bite to her R hand on the dorsum. It was healing until 3-4 days ago when she saw worsening dorsal swelling and pain.   History reviewed with family. She is in the OR at the time of my H&P  Past Medical History:  Diagnosis Date  . B12 deficiency   . Borderline diabetes   . COPD (chronic obstructive pulmonary disease) ([place])    3L home O2  . HTN (hypertension)    Past Surgical History:  Procedure Laterality Date  . ANKLE FRACTURE SURGERY Right    Social History   Socioeconomic History  . Marital status: Widowed    Spouse name: Not on file  . Number of children: Not on file  . Years of education: Not on file  . Highest education level: Not on file  Occupational History  . Not on file  Social Needs  . Financial resource strain: Not on file  . Food insecurity:    Worry: Not on file    Inability: Not on file  . Transportation needs:    Medical: Not on file    Non-medical: Not on file  Tobacco Use  . Smoking status: Former Smoker    Packs/day: 1.00    Years: 50.00    Pack years: 50.00    Last attempt to quit: 2000    Years since quitting: 19.6  . Smokeless tobacco: Never Used  Substance and Sexual Activity  . Alcohol use: Never    Frequency: Never  . Drug use: Never  . Sexual activity: Not on file  Lifestyle  . Physical activity:    Days per week: Not on file    Minutes per session: Not on file  . Stress: Not on file  Relationships  . Social connections:    Talks on phone: Not on file    Gets together: Not on file    Attends religious service: Not on file    Active member of club or organization: Not on file    Attends meetings of clubs or organizations: Not on file    Relationship status: Not on file  Other Topics Concern  . Not on file  Social History Narrative  . Not on file     History reviewed. No pertinent family history. Allergies  Allergen Reactions  . Atorvastatin Other (See Comments)    Made arms very sore  . Colchicine Other (See Comments)  . Tape Other (See Comments)    Patient bruises VERY easily and skin is FRAGILE!!   Prior to Admission medications   Medication Sig Start Date End Date Taking? Authorizing Provider  albuterol (PROAIR HFA) 108 (90 Base) MCG/ACT inhaler Inhale 2 puffs into the lungs every 6 (six) hours as needed for wheezing or shortness of breath.   Yes [provider]  albuterol (PROVENTIL) (2.5 MG/3ML) 0.083% nebulizer solution Take 2.5 mg by nebulization every 6 (six) hours as needed for wheezing or shortness of breath.   Yes [provider]  ALPRAZolam Duanne Moron) 0.5 MG tablet Take 0.5 mg by mouth 3 (three) times daily as needed for anxiety.   Yes [provider]  amLODipine (NORVASC) 10 MG tablet Take 10 mg by mouth daily.   Yes [provider]  aspirin EC 81 MG tablet Take 81 mg by mouth daily.   Yes [provider]  benazepril (LOTENSIN) 20 MG tablet Take 20 mg by mouth daily.   Yes [provider]  cloNIDine (CATAPRES) 0.2 MG tablet Take 0.2 mg by mouth 2 (two) times daily.   Yes [provider]  Cyanocobalamin (B-12 COMPLIANCE INJECTION) 1000 MCG/ML KIT Inject 1 mL as directed every 30 (thirty) days.   Yes [provider]  Fluticasone-Umeclidin-Vilant (TRELEGY ELLIPTA) 100-62.5-25 MCG/INH AEPB Inhale 1 puff into the lungs daily.   Yes [provider]  furosemide (LASIX) 20 MG tablet Take 20-40 mg by mouth See admin instructions. Take 40 mg by mouth in the morning and 20 mg in the evening   Yes [provider]  HYDROcodone-acetaminophen (NORCO) 7.5-325 MG tablet Take 1 tablet by mouth every 8 (eight) hours as needed (for pain).   Yes [provider]  metoprolol succinate (TOPROL-XL) 50 MG 24 hr tablet Take 50 mg by mouth daily. Take with  or immediately following a meal.   Yes [provider]  naproxen sodium (ALEVE) 220 MG tablet Take 220-440 mg by mouth 2 (two) times daily as needed (for headaches).   Yes [provider]  omeprazole (PRILOSEC) 40 MG capsule Take 40 mg by mouth daily.   Yes [provider]  OXYGEN Inhale 3 L into the lungs continuous.   Yes [provider]   Dg Chest Portable 1 View  Result Date: 02/18/2018 CLINICAL DATA:  [place] at home, leg injury. EXAM: PORTABLE CHEST 1 VIEW COMPARISON:  Chest radiograph September 04, 2017 FINDINGS: Cardiac silhouette is moderately enlarged. Calcified aortic arch. Mild chronic interstitial changes with LEFT lung base bandlike density. Increased lung volumes. Biapical pleural thickening. No pneumothorax. Osteopenia. Soft tissue planes are non suspicious. IMPRESSION: 1. Increased lung volumes, possible COPD. LEFT lung base atelectasis/scarring. 2. Cardiomegaly. 3.  Aortic Atherosclerosis (ICD10-I70.0). Electronically Signed   By: Elon Alas M.D.   On: 02/18/2018 14:42   Dg Tibia/fibula Left Port  Result Date: 02/18/2018 CLINICAL DATA:  Post reduction of open left ankle fracture. EXAM: PORTABLE LEFT TIBIA AND FIBULA - 2 VIEW COMPARISON:  Plain film of the LEFT ankle from earlier same day. FINDINGS: Markedly improved alignment of the osseous structures of the LEFT ankle. Ankle mortise appear symmetric. Overlying casting in place. IMPRESSION: Markedly improved alignment of the osseous structures at the LEFT ankle. Electronically Signed   By: Franki Cabot M.D.   On: 02/18/2018 15:50   Dg Ankle Left Port  Result Date: 02/18/2018 CLINICAL DATA:  Post reduction of open left ankle fracture. EXAM: PORTABLE LEFT ANKLE - 2 VIEW COMPARISON:  Plain films from earlier same day. FINDINGS: There has been markedly improved alignment of the osseous structures at the LEFT ankle. Ankle mortise now appears symmetric. Visualized portions of the hindfoot and midfoot  appear intact and normally aligned. Overlying casting in place. IMPRESSION: Markedly improved alignment of the osseous structures at the LEFT ankle. Casting in place. Electronically Signed   By: Franki Cabot M.D.   On: 02/18/2018 15:51   Dg Ankle Left Port  Result Date: 02/18/2018 CLINICAL DATA:  84 year old female with a history tib-fib fracture after a fall EXAM: PORTABLE LEFT ANKLE - 2 VIEW COMPARISON:  None. FINDINGS: Osteopenia. Lateral talar fracture dislocation with fracture of the medial malleolus, lateral malleolus, and possible posterior malleolar fracture on the lateral view. Soft tissue swelling and subcutaneous gas. IMPRESSION: Open talar fracture dislocation, with plain film evidence of trimalleolar fracture and lateral talar dislocation. Osteopenia. Electronically Signed   By: York Cerise  Earleen Newport D.O.   On: 02/18/2018 14:42    Positive ROS: All other systems have been reviewed and were otherwise negative with the exception of those mentioned in the HPI and as above.  Labs cbc Recent Labs    02/18/18 1404  WBC 26.1*  HGB 11.5*  HCT 36.0  PLT 143*    Labs inflam No results for input(s): CRP in the last 72 hours.  Invalid input(s): ESR  Labs coag No results for input(s): INR, PTT in the last 72 hours.  Invalid input(s): PT  Recent Labs    02/18/18 1404  NA 142  K 4.0  CL 107  CO2 25  GLUCOSE 121*  BUN 40*  CREATININE 1.72*  CALCIUM 8.8*    Physical Exam: Vitals:   02/18/18 1615 02/18/18 1630  BP: (!) 100/37 (!) 117/49  Pulse: (!) 55 (!) 56  Resp: 18 18  Temp:    SpO2: 98% 98%    Cardiovascular: No pedal edema Respiratory: No cyanosis, no use of accessory musculature GI: No organomegaly, abdomen is soft Skin: No lesions in the area of chief complaint other than those listed below in MSK exam.    MUSCULOSKELETAL:  Dr. Marlou Sa is caring for LLE. RUE: swelling and erythema about R hand injury. fluctuance Other extremities are atraumatic with painless ROM  and NVI.  Assessment: Dorsal hand wound and infection.   Plan: While she is in the OR I have discussed the case with DR Marlou Sa as well as her Daughter. We are all in agreement that her hand will need an I&D to heal this infection. I have recommended that we do this while she is already under anesthesia. All are in agreement (daughter, Dr. Marlou Sa, myself).    Renette Butters, MD Cell [phone]   02/18/2018 6:11 PM

## 2018-02-18 NOTE — ED Provider Notes (Signed)
[facility] EMERGENCY DEPARTMENT Provider Note   CSN: [id number] Arrival date & time: 02/18/18  1342     History   Chief Complaint Chief Complaint  Patient presents with  . Fall  . tib fib fx    HPI Ashley Mora is a 84 y.o. female.  Ashley Mora is a 84 y.o. female with history of COPD, prediabetes, hypertension, presents to emergency department complaining of ankle injury.  Patient states she was walking into the house, was going up to steps when she missed a step and fell twisting her left ankle.  She denies hitting her head or loss of consciousness.  She denies pain anywhere else other than her left ankle.  Obvious deformity, open fracture to the left ankle.  Patient's tetanus was just updated this morning.  In fact she was seen by her primary care doctor this morning for right hand cellulitis after a cat bite that was a week and a half ago.  She states she was bit by her cat that is indoor outdoor.  She states the bite happened about a week and a half ago, but the redness did not start for the last 2 days.  She was placed on antibiotics today, her tetanus was updated.  She states that the rabies of the cat are not up-to-date so they would need to contact [place] control to quarantine the cat.  Patient received 150 mcg of fentanyl and 1 g of Ancef by EMS.  Her pain is fairly well-controlled at this time. Denies numbness or weakness distal to ankle.   Past Medical History:  Diagnosis Date  . B12 deficiency   . COPD (chronic obstructive pulmonary disease) ([facility])   . HTN (hypertension)     There are no active problems to display for this patient.    OB History   None      Home Medications    Prior to Admission medications   Not on File    Family History No family history on file.  Social History Social History   Tobacco Use  . Smoking status: Former Games developer  . Smokeless tobacco: Never Used  Substance Use Topics  . Alcohol use: Never   Frequency: Never  . Drug use: Never     Allergies   Patient has no allergy information on record.   Review of Systems Review of Systems  Constitutional: Negative for chills and fever.  Respiratory: Negative for cough, chest tightness and shortness of breath.   Cardiovascular: Negative for chest pain, palpitations and leg swelling.  Gastrointestinal: Negative for abdominal pain, diarrhea, nausea and vomiting.  Genitourinary: Negative for dysuria, flank pain, pelvic pain, vaginal bleeding, vaginal discharge and vaginal pain.  Musculoskeletal: Positive for arthralgias, joint swelling and myalgias. Negative for neck pain and neck stiffness.  Skin: Positive for color change and wound.  Neurological: Negative for dizziness, weakness, numbness and headaches.  All other systems reviewed and are negative.    Physical Exam Updated Vital Signs BP (!) 173/60 (BP Location: Left Arm)   Pulse 75   Temp 98.2 F (36.8 C) (Oral)   Resp 16   SpO2 100%   Physical Exam  Constitutional: She is oriented to person, place, and time. She appears well-developed and well-nourished. No distress.  HENT:  Head: Normocephalic and atraumatic.  Eyes: Pupils are equal, round, and reactive to light. Conjunctivae and EOM are normal.  Neck: Neck supple.  Cardiovascular: Normal rate, regular rhythm and normal heart sounds.  Pulmonary/Chest: Effort normal and  breath sounds normal. No respiratory distress. She has no wheezes. She has no rales.  Abdominal: Soft. Bowel sounds are normal. She exhibits no distension. There is no tenderness. There is no rebound.  Musculoskeletal: She exhibits no edema.  Left ankle open fracture dislocation. DP pulses intact. Foot pink, warm. Able to move toes. Knee non tender. Hips stable and nontender bilaterally.  Right ankle and knee normal.  No spine tenderness.  There is erythema, warmth, swelling to the right dorsal hand and lateral palmar hand.  There is a skin wound to the  dorsal hand from a cat bite.  Erythema extends into the proximal fourth and fifth fingers as well as distal forearm.  No drainage.  Neurological: She is alert and oriented to person, place, and time.  Skin: Skin is warm and dry.  Psychiatric: She has a normal mood and affect. Her behavior is normal.  Nursing note and vitals reviewed.    ED Treatments / Results  Labs (all labs ordered are listed, but only abnormal results are displayed) Labs Reviewed  CBC WITH DIFFERENTIAL/PLATELET - Abnormal; Notable for the following components:      Result Value   WBC 26.1 (*)    RBC 3.83 (*)    Hemoglobin 11.5 (*)    Platelets 143 (*)    Neutro Abs 22.7 (*)    Monocytes Absolute 2.1 (*)    All other components within normal limits  BASIC METABOLIC PANEL - Abnormal; Notable for the following components:   Glucose, Bld 121 (*)    BUN 40 (*)    Creatinine, Ser 1.72 (*)    Calcium 8.8 (*)    GFR calc non Af Amer 26 (*)    GFR calc Af Amer 30 (*)    All other components within normal limits    EKG None  Radiology Dg Chest Portable 1 View  Result Date: 02/18/2018 CLINICAL DATA:  [place] [address] at home, leg injury. EXAM: PORTABLE CHEST 1 VIEW COMPARISON:  Chest radiograph September 04, 2017 FINDINGS: Cardiac silhouette is moderately enlarged. Calcified aortic arch. Mild chronic interstitial changes with LEFT lung base bandlike density. Increased lung volumes. Biapical pleural thickening. No pneumothorax. Osteopenia. Soft tissue planes are non suspicious. IMPRESSION: 1. Increased lung volumes, possible COPD. LEFT lung base atelectasis/scarring. 2. Cardiomegaly. 3.  Aortic Atherosclerosis (ICD10-I70.0). Electronically Signed   By: Awilda Metro M.D.   On: 02/18/2018 14:42   Dg Tibia/fibula Left Port  Result Date: 02/18/2018 CLINICAL DATA:  Post reduction of open left ankle fracture. EXAM: PORTABLE LEFT TIBIA AND FIBULA - 2 VIEW COMPARISON:  Plain film of the LEFT ankle from earlier same day. FINDINGS:  Markedly improved alignment of the osseous structures of the LEFT ankle. Ankle mortise appear symmetric. Overlying casting in place. IMPRESSION: Markedly improved alignment of the osseous structures at the LEFT ankle. Electronically Signed   By: Bary Richard M.D.   On: 02/18/2018 15:50   Dg Ankle Left Port  Result Date: 02/18/2018 CLINICAL DATA:  Post reduction of open left ankle fracture. EXAM: PORTABLE LEFT ANKLE - 2 VIEW COMPARISON:  Plain films from earlier same day. FINDINGS: There has been markedly improved alignment of the osseous structures at the LEFT ankle. Ankle mortise now appears symmetric. Visualized portions of the hindfoot and midfoot appear intact and normally aligned. Overlying casting in place. IMPRESSION: Markedly improved alignment of the osseous structures at the LEFT ankle. Casting in place. Electronically Signed   By: Bary Richard M.D.   On: 02/18/2018 15:51  Dg Ankle Left Port  Result Date: 02/18/2018 CLINICAL DATA:  84 year old female with a history tib-fib fracture after a fall EXAM: PORTABLE LEFT ANKLE - 2 VIEW COMPARISON:  None. FINDINGS: Osteopenia. Lateral talar fracture dislocation with fracture of the medial malleolus, lateral malleolus, and possible posterior malleolar fracture on the lateral view. Soft tissue swelling and subcutaneous gas. IMPRESSION: Open talar fracture dislocation, with plain film evidence of trimalleolar fracture and lateral talar dislocation. Osteopenia. Electronically Signed   By: Gilmer MorJaime  Wagner D.O.   On: 02/18/2018 14:42    Procedures Procedures (including critical care time)  Medications Ordered in ED Medications  fentaNYL (SUBLIMAZE) injection 50 mcg (has no administration in time range)  ceFAZolin (ANCEF) IVPB 1 g/50 mL premix (1 g Intravenous New Bag/Given 02/18/18 1630)  chlorhexidine (HIBICLENS) 4 % liquid 4 application (has no administration in time range)  povidone-iodine 10 % swab 2 application (has no administration in time  range)  0.9 %  sodium chloride infusion (has no administration in time range)  propofol (DIPRIVAN) 10 mg/mL bolus/IV push 10 mg (40 mg Intravenous Given 02/18/18 1458)  State   Initial Impression / Assessment and Plan / ED Course  I have reviewed the triage vital signs and the nursing notes.  Pertinent labs & imaging results that were available during my care of the patient were reviewed by me and considered in my medical decision making (see chart for details).     Patient with left open distal tib-fib/ankle fracture.  Irrigated with saline, with dressing applied for now.  Will get x-rays and procedural sedation set up to reduce the ankle.  She is neurovascularly intact.  She is also here with cellulitis to the right hand for which she was seen earlier.  Cellulitis from a cat bite.  No other injuries from the fall..   Patient's ankle was reduced with procedural sedation, another gram of Ancef given.  She had 1 g by EMS.  Her pain is well controlled at this time.  Labs pending.  Discussed with orthopedics, will plan on surgery.  Asked for medicine to admit.  Spoke with registration, they will fill out and fax animal control form.   Vitals:   02/18/18 1545 02/18/18 1600 02/18/18 1615 02/18/18 1630  BP: 126/73 109/81 (!) 100/37 (!) 117/49  Pulse: (!) 59 (!) 56 (!) 55 (!) 56  Resp: 16 12 18 18   Temp:      TempSrc:      SpO2: 99% 98% 98% 98%      Final Clinical Impressions(s) / ED Diagnoses   Final diagnoses:  Fall  Ankle fracture  Type I or II open fracture dislocation of left ankle, initial encounter  Cellulitis of finger of right hand    ED Discharge Orders    None       Jaynie CrumbleKirichenko, Arrionna Serena, PA-C 02/18/18 1634    Arby BarrettePfeiffer, Marcy, MD 02/19/18 [phone]

## 2018-02-18 NOTE — Transfer of Care (Signed)
Immediate Anesthesia Transfer of Care Note  Patient: Charleston RopesVirgina Sills  Procedure(s) Performed: IRRIGATION AND DEBRIDEMENT EXTREMITY (Left ) EXTERNAL FIXATION LEG (Left ) APPLICATION OF WOUND VAC (Left ) IRRIGATION AND DEBRIDEMENT WOUND (Right Hand)  Patient Location: PACU  Anesthesia Type:General  Level of Consciousness: awake, alert , oriented and patient cooperative  Airway & Oxygen Therapy: Patient Spontanous Breathing and Patient connected to nasal cannula oxygen  Post-op Assessment: Report given to RN and Post -op Vital signs reviewed and stable  Post vital signs: Reviewed and stable  Last Vitals:  Vitals Value Taken Time  BP 159/79 02/18/2018  7:06 PM  Temp 36.6 C 02/18/2018  7:06 PM  Pulse 85 02/18/2018  7:06 PM  Resp 13 02/18/2018  7:15 PM  SpO2 93 % 02/18/2018  7:06 PM  Vitals shown include unvalidated device data.  Last Pain:  Vitals:   02/18/18 1906  TempSrc:   PainSc: 10-Worst pain ever         Complications: No apparent anesthesia complications

## 2018-02-18 NOTE — Progress Notes (Signed)
Orthopedic Tech Progress Note Patient Details:  Ashley RopesVirgina Mora 1932-12-22 [id number]  Ortho Devices Type of Ortho Device: Ace wrap, Stirrup splint, Post (short leg) splint Ortho Device/Splint Interventions: Application   Post Interventions Patient Tolerated: Well Instructions Provided: Care of device   [name] 02/18/2018, 3:21 PM

## 2018-02-18 NOTE — Anesthesia Preprocedure Evaluation (Signed)
Anesthesia Evaluation  Patient identified by MRN, date of birth, ID band Patient awake    Reviewed: Allergy & Precautions, NPO status , Patient's Chart, lab work & pertinent test results  Airway Mallampati: II  TM Distance: >3 FB Neck ROM: Full    Dental no notable dental hx.    Pulmonary COPD, former smoker,    Pulmonary exam normal breath sounds clear to auscultation       Cardiovascular hypertension, Pt. on medications Normal cardiovascular exam Rhythm:Regular Rate:Normal     Neuro/Psych negative neurological ROS  negative psych ROS   GI/Hepatic negative GI ROS, Neg liver ROS,   Endo/Other  negative endocrine ROS  Renal/GU negative Renal ROS     Musculoskeletal negative musculoskeletal ROS (+)   Abdominal   Peds  Hematology negative hematology ROS (+)   Anesthesia Other Findings   Reproductive/Obstetrics negative OB ROS                             Anesthesia Physical Anesthesia Plan  ASA: III  Anesthesia Plan: General   Post-op Pain Management:    Induction: Intravenous  PONV Risk Score and Plan: 4 or greater and Ondansetron, Dexamethasone and Treatment may vary due to age or medical condition  Airway Management Planned: Oral ETT  Additional Equipment:   Intra-op Plan:   Post-operative Plan: Extubation in OR  Informed Consent: I have reviewed the patients History and Physical, chart, labs and discussed the procedure including the risks, benefits and alternatives for the proposed anesthesia with the patient or authorized representative who has indicated his/her understanding and acceptance.   Dental advisory given  Plan Discussed with: CRNA  Anesthesia Plan Comments:         Anesthesia Quick Evaluation

## 2018-02-18 NOTE — ED Provider Notes (Signed)
Medical screening examination/treatment/procedure(s) were conducted as a shared visit with non-physician practitioner(s) and myself.  I personally evaluated the patient during the encounter.  None .Sedation Date/Time: 02/18/2018 3:13 PM Performed by: Arby Barrette, MD Authorized by: Arby Barrette, MD   Consent:    Consent obtained:  Verbal   Consent given by:  Patient   Risks discussed:  Allergic reaction, dysrhythmia, inadequate sedation, nausea, prolonged hypoxia resulting in organ damage, prolonged sedation necessitating reversal, respiratory compromise necessitating ventilatory assistance and intubation and vomiting   Alternatives discussed:  Analgesia without sedation, anxiolysis and regional anesthesia Universal protocol:    Procedure explained and questions answered to patient or proxy's satisfaction: yes     Relevant documents present and verified: yes     Test results available and properly labeled: yes     Imaging studies available: yes     Required blood products, implants, devices, and special equipment available: yes     Site/side marked: yes     Immediately prior to procedure a time out was called: yes     Patient identity confirmation method:  Verbally with patient Indications:    Procedure performed:  Fracture reduction   Procedure necessitating sedation performed by:  Physician performing sedation   Intended level of sedation:  Deep Pre-sedation assessment:    Time since last food or drink:  6am   ASA classification: class 1 - normal, healthy patient     Neck mobility: normal     Mouth opening:  3 or more finger widths   Thyromental distance:  4 finger widths   Mallampati score:  I - soft palate, uvula, fauces, pillars visible   Pre-sedation assessments completed and reviewed: airway patency, cardiovascular function, hydration status, mental status, nausea/vomiting, pain level, respiratory function and temperature   Immediate pre-procedure details:   Reassessment: Patient reassessed immediately prior to procedure     Reviewed: vital signs, relevant labs/tests and NPO status     Verified: bag valve mask available, emergency equipment available, intubation equipment available, IV patency confirmed, oxygen available and suction available   Procedure details (see MAR for exact dosages):    Preoxygenation:  Nasal cannula   Sedation:  Propofol   Intra-procedure monitoring:  Blood pressure monitoring, cardiac monitor, continuous pulse oximetry, frequent LOC assessments, frequent vital sign checks and continuous capnometry   Intra-procedure events: none     Total Provider sedation time (minutes):  20 Post-procedure details:    Post-sedation assessment completed:  02/18/2018 3:14 PM   Attendance: Constant attendance by certified staff until patient recovered     Recovery: Patient returned to pre-procedure baseline     Post-sedation assessments completed and reviewed: airway patency, cardiovascular function, hydration status, mental status, nausea/vomiting, pain level, respiratory function and temperature     Patient is stable for discharge or admission: yes     Patient tolerance:  Tolerated well, no immediate complications Reduction of dislocation Date/Time: 02/18/2018 3:14 PM Performed by: Arby Barrette, MD Authorized by: Arby Barrette, MD  Consent: Verbal consent obtained. Consent given by: patient Patient understanding: patient states understanding of the procedure being performed Patient consent: the patient's understanding of the procedure matches consent given Procedure consent: procedure consent matches procedure scheduled Relevant documents: relevant documents present and verified Test results: test results available and properly labeled Site marked: the operative site was marked Imaging studies: imaging studies available Required items: required blood products, implants, devices, and special equipment available Patient identity  confirmed: verbally with patient and arm band Time out: Immediately prior  to procedure a "time out" was called to verify the correct patient, procedure, equipment, support staff and site/side marked as required. Local anesthesia used: no  Anesthesia: Local anesthesia used: no  Sedation: Patient sedated: yes Sedation type: moderate (conscious) sedation Sedatives: propofol Sedation start date/time: 02/18/2018 2:50 PM Sedation end date/time: 02/18/2018 3:15 PM Vitals: Vital signs were monitored during sedation.  Patient tolerance: Patient tolerated the procedure well with no immediate complications      Arby BarrettePfeiffer, Kadee Philyaw, MD 02/18/18 [phone]

## 2018-02-18 NOTE — Anesthesia Procedure Notes (Signed)
Procedure Name: Intubation Date/Time: 02/18/2018 5:41 PM Performed by: Candis Shine, CRNA Pre-anesthesia Checklist: Patient identified, Emergency Drugs available, Suction available and Patient being monitored Patient Re-evaluated:Patient Re-evaluated prior to induction Oxygen Delivery Method: Circle System Utilized Preoxygenation: Pre-oxygenation with 100% oxygen Induction Type: IV induction Ventilation: Mask ventilation without difficulty and Oral airway inserted - appropriate to patient size Laryngoscope Size: Mac and 3 Grade View: Grade I Tube type: Oral Tube size: 7.0 mm Number of attempts: 1 Airway Equipment and Method: Stylet and Oral airway Placement Confirmation: ETT inserted through vocal cords under direct vision,  positive ETCO2 and breath sounds checked- equal and bilateral Secured at: 22 cm Tube secured with: Tape Dental Injury: Teeth and Oropharynx as per pre-operative assessment

## 2018-02-18 NOTE — H&P (Signed)
History and Physical    Ashley Mora UJW:[id number] DOB: 10-Oct-1932 DOA: 02/18/2018  PCP:  Berenda [facility] Consultants:  [facility] - pulmonology; [facility] - orthopedics Patient coming from:  Home - lives with son and daughter-in-law; NOK: Shari Heritage and daughter-in-law, [phone]  Chief Complaint:  Ankle fracture  HPI: Ashley Mora is a 84 y.o. female with medical history significant of  COPD on 3L home O2; preDM; HTN presenting with open fracture/dislocation.  She was going into her house and she tripped over the threshold and fell.  She landed on the steps and had an open fracture.  She had a similar fracture on the right about 5 years ago.  She also had a cat bite.  She jerked her hand when she bit.  It looked like it was healing and about 3-4 days ago it started swelling.  She did see the doctor about this today.  She has an appointment with the orthopedist tomorrow regarding chronic progressive knee pain.   ED Course:   Mechanical fall, no other injuries.  Reduced ankle.  Got Ancef.  Going to OR today.  Also with right hand cellulitis by cat bite - shots not up to date, happened 1 1/2 weeks ago.  She saw PCP this AM, got tetanus and antibiotics.  No fever or sepsis.  Orthopedics also looked at the hand.  Review of Systems: As per HPI; otherwise review of systems reviewed and negative.   Ambulatory Status:  Ambulates with a walker  Past Medical History:  Diagnosis Date  . B12 deficiency   . Borderline diabetes   . COPD (chronic obstructive pulmonary disease) (HCC)    3L home O2  . HTN (hypertension)     Past Surgical History:  Procedure Laterality Date  . ANKLE FRACTURE SURGERY Right     Social History   Socioeconomic History  . Marital status: Widowed    Spouse name: Not on file  . Number of children: Not on file  . Years of education: Not on file  . Highest education level: Not on file  Occupational History  . Not on file  Social Needs  . Financial resource  strain: Not on file  . Food insecurity:    Worry: Not on file    Inability: Not on file  . Transportation needs:    Medical: Not on file    Non-medical: Not on file  Tobacco Use  . Smoking status: Former Smoker    Packs/day: 1.00    Years: 50.00    Pack years: 50.00    Last attempt to quit: 2000    Years since quitting: 19.6  . Smokeless tobacco: Never Used  Substance and Sexual Activity  . Alcohol use: Never    Frequency: Never  . Drug use: Never  . Sexual activity: Not on file  Lifestyle  . Physical activity:    Days per week: Not on file    Minutes per session: Not on file  . Stress: Not on file  Relationships  . Social connections:    Talks on phone: Not on file    Gets together: Not on file    Attends religious service: Not on file    Active member of club or organization: Not on file    Attends meetings of clubs or organizations: Not on file    Relationship status: Not on file  . Intimate partner violence:    Fear of current or ex partner: Not on file    Emotionally abused: Not on  file    Physically abused: Not on file    Forced sexual activity: Not on file  Other Topics Concern  . Not on file  Social History Narrative  . Not on file    Allergies  Allergen Reactions  . Atorvastatin Other (See Comments)    Made arms very sore  . Colchicine Other (See Comments)  . Tape Other (See Comments)    Patient bruises VERY easily and skin is FRAGILE!!    History reviewed. No pertinent family history.  Prior to Admission medications   Not on File    Physical Exam: Vitals:   02/18/18 1545 02/18/18 1600 02/18/18 1615 02/18/18 1630  BP: 126/73 109/81 (!) 100/37 (!) 117/49  Pulse: (!) 59 (!) 56 (!) 55 (!) 56  Resp: 16 12 18 18   Temp:      TempSrc:      SpO2: 99% 98% 98% 98%     General:  Appears calm and comfortable and is NAD Eyes:  PERRL, EOMI, normal lids, iris ENT:  grossly normal hearing, lips & tongue, mmm Neck:  no LAD, masses or thyromegaly; no  carotid bruits Cardiovascular:  RRR, no m/r/g. No LE edema.  Respiratory:   CTA bilaterally with no wheezes/rales/rhonchi.  Normal respiratory effort. Abdomen:  soft, NT, ND, NABS Skin: very erythematous confluent rash on right wrist and extending up the distal forearm     Musculoskeletal: L ankle is in a splint with good circulation in her toes Psychiatric:  grossly normal mood and affect, speech fluent and appropriate, AOx3 Neurologic:  CN 2-12 grossly intact, moves all extremities in coordinated fashion, sensation intact    Radiological Exams on Admission: Dg Chest Portable 1 View  Result Date: 02/18/2018 CLINICAL DATA:  Larey SeatFell at home, leg injury. EXAM: PORTABLE CHEST 1 VIEW COMPARISON:  Chest radiograph September 04, 2017 FINDINGS: Cardiac silhouette is moderately enlarged. Calcified aortic arch. Mild chronic interstitial changes with LEFT lung base bandlike density. Increased lung volumes. Biapical pleural thickening. No pneumothorax. Osteopenia. Soft tissue planes are non suspicious. IMPRESSION: 1. Increased lung volumes, possible COPD. LEFT lung base atelectasis/scarring. 2. Cardiomegaly. 3.  Aortic Atherosclerosis (ICD10-I70.0). Electronically Signed   By: Awilda Metroourtnay  Bloomer M.D.   On: 02/18/2018 14:42   Dg Tibia/fibula Left Port  Result Date: 02/18/2018 CLINICAL DATA:  Post reduction of open left ankle fracture. EXAM: PORTABLE LEFT TIBIA AND FIBULA - 2 VIEW COMPARISON:  Plain film of the LEFT ankle from earlier same day. FINDINGS: Markedly improved alignment of the osseous structures of the LEFT ankle. Ankle mortise appear symmetric. Overlying casting in place. IMPRESSION: Markedly improved alignment of the osseous structures at the LEFT ankle. Electronically Signed   By: Bary RichardStan  Maynard M.D.   On: 02/18/2018 15:50   Dg Ankle Left Port  Result Date: 02/18/2018 CLINICAL DATA:  Post reduction of open left ankle fracture. EXAM: PORTABLE LEFT ANKLE - 2 VIEW COMPARISON:  Plain films from  earlier same day. FINDINGS: There has been markedly improved alignment of the osseous structures at the LEFT ankle. Ankle mortise now appears symmetric. Visualized portions of the hindfoot and midfoot appear intact and normally aligned. Overlying casting in place. IMPRESSION: Markedly improved alignment of the osseous structures at the LEFT ankle. Casting in place. Electronically Signed   By: Bary RichardStan  Maynard M.D.   On: 02/18/2018 15:51   Dg Ankle Left Port  Result Date: 02/18/2018 CLINICAL DATA:  84 year old female with a history tib-fib fracture after a fall EXAM: PORTABLE LEFT ANKLE - 2 VIEW  COMPARISON:  None. FINDINGS: Osteopenia. Lateral talar fracture dislocation with fracture of the medial malleolus, lateral malleolus, and possible posterior malleolar fracture on the lateral view. Soft tissue swelling and subcutaneous gas. IMPRESSION: Open talar fracture dislocation, with plain film evidence of trimalleolar fracture and lateral talar dislocation. Osteopenia. Electronically Signed   By: Gilmer Mor D.O.   On: 02/18/2018 14:42    EKG: pending   Labs on Admission: I have personally reviewed the available labs and imaging studies at the time of the admission.  Pertinent labs:   Glucose 121 BUN 40/Creatinine 1.72/GFR 26 WBC 26.1 Hgb 11.5 Platelets 143   Assessment/Plan Principal Problem:   Open ankle fracture, left, type I or II, initial encounter Active Problems:   COPD (chronic obstructive pulmonary disease) (HCC)   Borderline diabetes mellitus   Essential hypertension   Pasteurella cellulitis due to cat bite   Renal failure   Open ankle fracture -Mechanical fall resulting in ankle fracture -s/p reduction in the ER -Has been splinted -She is going to the OR now -Management as per orthopedics -Start Lovenox post-operatively (or as per ortho) -Pain control with Robxain, Vicodin, and Morphine prn -SW consult for rehab placement -Will need PT consult  post-operatively  Cellulitis -Patient with cat bite recently but noticed erythema over the last few days -Seen by PCP today, concern for cellulitis -This alone may have been sufficient to warrant hospitalization -Start treatment with IV Unasyn to cover pasteurella -Will monitor while she is inpatient -She has rx for Augmentin already that she can take after d/c (has not started taking)  Renal failure -Uncertain baseline creatinine and no documentation available at this time -Will hydrate and follow but suspect at least a component of this is related to CKD  Chronic respiratory failure due to COPD, on home O2 -Continue home O2 -Continue home trelegy -Continue Albuterol prn but increase frequency to q2h prn  HTN -Hold Lotensin and consider d/c based on renal function -Continue Catapres, Toprol XL  Borderline DM -Will check A1c -Cover with sensitive-scale SSI    DVT prophylaxis:   Lovenox post-surgery Code Status:  Full - confirmed with patient/family Family Communication: Daughter-in-law present throughout evalaution  Disposition Plan:  To be determined Consults called: Orthopedics; SW, will need PT post-operatively  Admission status: Admit - It is my clinical opinion that admission to INPATIENT is reasonable and necessary because of the expectation that this patient will require hospital care that crosses at least 2 midnights to treat this condition based on the medical complexity of the problems presented.  Given the aforementioned information, the predictability of an adverse outcome is felt to be significant.    Jonah Blue MD [facility]  If note is complete, please contact covering daytime or nighttime physician. [email or web address] Password [email or web address]  02/18/2018, 5:20 PM

## 2018-02-18 NOTE — Op Note (Signed)
NAMCharleston Ropes: Landin, VIRGINA MEDICAL RECORD [id number] ACCOUNT [id number] DATE OF BIRTH:June 08, 1933 FACILITY: [facility] LOCATION: [id number] PHYSICIAN:Reyna Lorenzi Diamantina ProvidenceS. Danny Zimny, MD  OPERATIVE REPORT  DATE OF PROCEDURE:  02/18/2018  PREOPERATIVE DIAGNOSIS:  Open left ankle fracture dislocation, trimalleolar.  POSTOPERATIVE DIAGNOSIS:  Open left ankle fracture dislocation, trimalleolar.  PROCEDURE:   1.  Irrigation and excisional debridement of devitalized appearing skin, subcutaneous tissue, muscle, fascia and bone for the open fracture.   2.  External fixation of open tibia-fibula fracture. 3.  Application of wound VAC.   4.  Interpretation of fluoroscopic imaging.  SURGEON:  Cammy CopaGregory Scott Evonda Enge, MD  ASSISTANT:  None.  INDICATIONS:  This is an 84 year old patient who had a fall today.  She sustained left tibia-fibula fracture and presents for operative management after explanation of risks and benefits.  PROCEDURE IN DETAIL:  The patient was brought to the operating room where general anesthetic was induced.  Preoperative IV antibiotics were administered.  Timeout was called.  Left leg was prescrubbed with chlorhexidine and then prepped with  chlorhexidine and draped in a sterile manner.  The patient had about an 8 cm transverse laceration extending at the level of the anterior tibial tendon across the inferior aspect of the medial malleolus back slightly anterior to the Achilles tendon.   This was a clean laceration through which the distal medial tibia protruded.  Using sharp dissection and curettes, this excisional debridement was performed of devitalized appearing skin, subcutaneous tissue, muscle and fascia.  Following this, 3 liters  of irrigating solution were used to irrigate out the joint and the surrounding tissues.  At this time, 2 pins were placed in the proximal tibia and a transfixing pin across the calcaneus.  These were Biomet pins for their external fixation device.  A  frame external fixator  was then applied with good fracture reduction.  This was confirmed in the AP and lateral planes under fluoroscopy.  At this time Vancomycin powder was placed into the incision and a wound VAC was applied.  Incisional wound VAC was  applied with good suction achieved.  Foot was then wrapped.  The patient tolerated the procedure well without immediate complications.  Plan is for repeat irrigation and debridement and fracture fixation in 2 days.  Overall, this was an open fracture,  but fairly clean laceration.  TN/NUANCE  D:02/18/2018 T:02/18/2018 JOB:[id number]

## 2018-02-18 NOTE — Consult Note (Signed)
Reason for Consult:Open left ankle fx Referring Physician: Calla Kicks  Ashley Mora is an 84 y.o. female.  HPI: Ashley Mora was walking up some stairs when she tripped and fell. She had immediate left ankle pain and an obvious open fx. She was brought to the ED and was not a trauma activation. She had just been to see her doctor about an infected cat bite to her right hand.  No past medical history on file.  No family history on file.  Social History:  has no tobacco, alcohol, and drug history on file.  Allergies: Allergies not on file  Medications: I have reviewed the patient's current medications.  Results for orders placed or performed during the hospital encounter of 02/18/18 (from the past 48 hour(s))  CBC with Differential     Status: Abnormal (Preliminary result)   Collection Time: 02/18/18  2:04 PM  Result Value Ref Range   WBC 26.1 (H) 4.0 - 10.5 K/uL   RBC 3.83 (L) 3.87 - 5.11 MIL/uL   Hemoglobin 11.5 (L) 12.0 - 15.0 g/dL   HCT 36.0 36.0 - 46.0 %   MCV 94.0 78.0 - 100.0 fL   MCH 30.0 26.0 - 34.0 pg   MCHC 31.9 30.0 - 36.0 g/dL   RDW 13.9 11.5 - 15.5 %   Platelets 143 (L) 150 - 400 K/uL    Comment: Performed at [facility], [place] [address] [place], [place] [address]   Neutrophils Relative % PENDING %   Neutro Abs PENDING 1.7 - 7.7 K/uL   Band Neutrophils PENDING %   Lymphocytes Relative PENDING %   Lymphs Abs PENDING 0.7 - 4.0 K/uL   Monocytes Relative PENDING %   Monocytes Absolute PENDING 0.1 - 1.0 K/uL   Eosinophils Relative PENDING %   Eosinophils Absolute PENDING 0.0 - 0.7 K/uL   Basophils Relative PENDING %   Basophils Absolute PENDING 0.0 - 0.1 K/uL   WBC Morphology PENDING    RBC Morphology PENDING    Smear Review PENDING    nRBC PENDING 0 /100 WBC   Metamyelocytes Relative PENDING %   Myelocytes PENDING %   Promyelocytes Relative PENDING %   Blasts PENDING %  Basic metabolic panel     Status: Abnormal   Collection Time: 02/18/18  2:04 PM   Result Value Ref Range   Sodium 142 135 - 145 mmol/L   Potassium 4.0 3.5 - 5.1 mmol/L   Chloride 107 98 - 111 mmol/L   CO2 25 22 - 32 mmol/L   Glucose, Bld 121 (H) 70 - 99 mg/dL   BUN 40 (H) 8 - 23 mg/dL   Creatinine, Ser 1.72 (H) 0.44 - 1.00 mg/dL   Calcium 8.8 (L) 8.9 - 10.3 mg/dL   GFR calc non Af Amer 26 (L) >60 mL/min   GFR calc Af Amer 30 (L) >60 mL/min    Comment: (NOTE) The eGFR has been calculated using the CKD EPI equation. This calculation has not been validated in all clinical situations. eGFR's persistently <60 mL/min signify possible Chronic Kidney Disease.    Anion gap 10 5 - 15    Comment: Performed at [facility] [address], [place], [place] [address]    Dg Chest Portable 1 View  Result Date: 02/18/2018 CLINICAL DATA:  [place] [address] at home, leg injury. EXAM: PORTABLE CHEST 1 VIEW COMPARISON:  Chest radiograph September 04, 2017 FINDINGS: Cardiac silhouette is moderately enlarged. Calcified aortic arch. Mild chronic interstitial changes with LEFT lung  base bandlike density. Increased lung volumes. Biapical pleural thickening. No pneumothorax. Osteopenia. Soft tissue planes are non suspicious. IMPRESSION: 1. Increased lung volumes, possible COPD. LEFT lung base atelectasis/scarring. 2. Cardiomegaly. 3.  Aortic Atherosclerosis (ICD10-I70.0). Electronically Signed   By: Elon Alas M.D.   On: 02/18/2018 14:42   Dg Ankle Left Port  Result Date: 02/18/2018 CLINICAL DATA:  84 year old female with a history tib-fib fracture after a fall EXAM: PORTABLE LEFT ANKLE - 2 VIEW COMPARISON:  None. FINDINGS: Osteopenia. Lateral talar fracture dislocation with fracture of the medial malleolus, lateral malleolus, and possible posterior malleolar fracture on the lateral view. Soft tissue swelling and subcutaneous gas. IMPRESSION: Open talar fracture dislocation, with plain film evidence of trimalleolar fracture and lateral talar dislocation. Osteopenia. Electronically Signed   By:  Corrie Mckusick D.O.   On: 02/18/2018 14:42    Review of Systems  Constitutional: Negative for weight loss.  HENT: Negative for ear discharge, ear pain, hearing loss and tinnitus.   Eyes: Negative for blurred vision, double vision, photophobia and pain.  Respiratory: Positive for shortness of breath (Chronic). Negative for cough and sputum production.   Cardiovascular: Negative for chest pain.  Gastrointestinal: Negative for abdominal pain, nausea and vomiting.  Genitourinary: Negative for dysuria, flank pain, frequency and urgency.  Musculoskeletal: Positive for joint pain (Left ankle, right hand). Negative for back pain, falls, myalgias and neck pain.  Neurological: Negative for dizziness, tingling, sensory change, focal weakness, loss of consciousness and headaches.  Endo/Heme/Allergies: Does not bruise/bleed easily.  Psychiatric/Behavioral: Negative for depression, memory loss and substance abuse. The patient is not nervous/anxious.    Blood pressure (!) 173/60, pulse 75, temperature 98.2 F (36.8 C), temperature source Oral, resp. rate 16, SpO2 100 %. Physical Exam  Constitutional: She appears well-developed and well-nourished. No distress.  HENT:  Head: Normocephalic and atraumatic.  Eyes: Conjunctivae are normal. Right eye exhibits no discharge. Left eye exhibits no discharge. No scleral icterus.  Neck: Normal range of motion.  Cardiovascular: Normal rate and regular rhythm.  Respiratory: Effort normal. No respiratory distress.  Musculoskeletal:  Right shoulder, elbow, wrist, digits- Marked erythema and edema to hand, distal forearm, mod TTP, no instability, no blocks to motion  Sens  Ax/R/M/U intact  Mot   Ax/ R/ PIN/ M/ AIN/ U intact  Rad 2+  LLE No ecchymosis or rash  Ankle deformity with tibia protruding from medial ankle  No knee effusion  Sens DPN, SPN, TN intact  Motor EHL 5/5  DP 2+  Neurological: She is alert.  Skin: Skin is warm and dry. She is not diaphoretic.   Psychiatric: She has a normal mood and affect. Her behavior is normal.    Assessment/Plan: Open left ankle fx -- EDP to reduce and splint. Will plan to take to OR for I&D, ex fix by Dr. Marlou Sa.  NPO until then. Right hand/forearm cellulitis -- Abx per IM Multiple medical problems including O2 dependent COPD, borderline DM, HLD -- Will ask IM to admit    Lisette Abu, PA-C Orthopedic Surgery [phone] 02/18/2018, 3:09 PM

## 2018-02-19 ENCOUNTER — Other Ambulatory Visit: Payer: Self-pay

## 2018-02-19 ENCOUNTER — Encounter (HOSPITAL_COMMUNITY): Payer: Self-pay | Admitting: Orthopedic Surgery

## 2018-02-19 LAB — BASIC METABOLIC PANEL
ANION GAP: 7 (ref 5–15)
BUN: 35 mg/dL — ABNORMAL HIGH (ref 8–23)
CO2: 25 mmol/L (ref 22–32)
Calcium: 8.3 mg/dL — ABNORMAL LOW (ref 8.9–10.3)
Chloride: 110 mmol/L (ref 98–111)
Creatinine, Ser: 1.52 mg/dL — ABNORMAL HIGH (ref 0.44–1.00)
GFR calc Af Amer: 35 mL/min — ABNORMAL LOW (ref >60)
GFR calc non Af Amer: 31 mL/min — ABNORMAL LOW (ref >60)
GLUCOSE: 223 mg/dL — AB (ref 70–99)
POTASSIUM: 4.5 mmol/L (ref 3.5–5.1)
Sodium: 142 mmol/L (ref 135–145)

## 2018-02-19 LAB — GLUCOSE, CAPILLARY
GLUCOSE-CAPILLARY: 153 mg/dL — AB (ref 70–99)
Glucose-Capillary: 146 mg/dL — ABNORMAL HIGH (ref 70–99)
Glucose-Capillary: 150 mg/dL — ABNORMAL HIGH (ref 70–99)
Glucose-Capillary: 155 mg/dL — ABNORMAL HIGH (ref 70–99)

## 2018-02-19 LAB — TYPE AND SCREEN
ABO/RH(D): O POS
ANTIBODY SCREEN: NEGATIVE

## 2018-02-19 LAB — CBC
HEMATOCRIT: 30.8 % — AB (ref 36.0–46.0)
Hemoglobin: 9.7 g/dL — ABNORMAL LOW (ref 12.0–15.0)
MCH: 29.7 pg (ref 26.0–34.0)
MCHC: 31.5 g/dL (ref 30.0–36.0)
MCV: 94.2 fL (ref 78.0–100.0)
Platelets: 129 10*3/uL — ABNORMAL LOW (ref 150–400)
RBC: 3.27 MIL/uL — ABNORMAL LOW (ref 3.87–5.11)
RDW: 13.8 % (ref 11.5–15.5)
WBC: 17.3 10*3/uL — AB (ref 4.0–10.5)

## 2018-02-19 LAB — MAGNESIUM: Magnesium: 1.9 mg/dL (ref 1.7–2.4)

## 2018-02-19 LAB — SURGICAL PCR SCREEN
MRSA, PCR: NEGATIVE
Staphylococcus aureus: NEGATIVE

## 2018-02-19 LAB — ABO/RH: ABO/RH(D): O POS

## 2018-02-19 LAB — HEMOGLOBIN A1C
Hgb A1c MFr Bld: 5.8 % — ABNORMAL HIGH (ref 4.8–5.6)
Mean Plasma Glucose: 119.76 mg/dL

## 2018-02-19 MED ORDER — HEPARIN SODIUM (PORCINE) 5000 UNIT/ML IJ SOLN
5000.0000 [IU] | Freq: Three times a day (TID) | INTRAMUSCULAR | Status: DC
  Administered 2018-02-19 (×2): 5000 [IU] via SUBCUTANEOUS
  Filled 2018-02-19 (×2): qty 1

## 2018-02-19 NOTE — Anesthesia Postprocedure Evaluation (Signed)
Anesthesia Post Note  Patient: Virgina WilCharleston Ropesson  Procedure(s) Performed: IRRIGATION AND DEBRIDEMENT EXTREMITY (Left ) EXTERNAL FIXATION LEG (Left ) APPLICATION OF WOUND VAC (Left ) IRRIGATION AND DEBRIDEMENT WOUND (Right Hand)     Patient location during evaluation: PACU Anesthesia Type: General Level of consciousness: sedated and patient cooperative Pain management: pain level controlled Vital Signs Assessment: post-procedure vital signs reviewed and stable Respiratory status: spontaneous breathing Cardiovascular status: stable Anesthetic complications: no    Last Vitals:  Vitals:   02/19/18 0456 02/19/18 0835  BP: (!) 124/58   Pulse: 67   Resp: 16   Temp: 36.6 C   SpO2: 95% 97%    Last Pain:  Vitals:   02/19/18 0552  TempSrc:   PainSc: 9                  Lewie LoronJohn Adrena Nakamura

## 2018-02-19 NOTE — Progress Notes (Addendum)
@IPLOG @        PROGRESS NOTE                                                                                                                                                                                                             Patient Demographics:    Ashley Mora, is a 84 y.o. female, DOB - 01-24-1933, ZOX:[id number]  Admit date - 02/18/2018   Admitting Physician Jonah Blue, MD  Outpatient Primary MD for the patient is [name], MD  LOS - 1  Chief Complaint  Patient presents with  . Fall  . tib fib fx       Brief Narrative  Ashley Mora is a 84 y.o. female with medical history significant of  COPD on 3L home O2; preDM; HTN presenting with open fracture/dislocation.  She was going into her house and she tripped over the threshold and fell.  She landed on the steps and had an open fracture.  She had a similar fracture on the right about 5 years ago.  She also had a cat bite.  She jerked her hand when she bit.  It looked like it was healing and about 3-4 days ago it started swelling.  She did see the doctor about this today.  She has an appointment with the orthopedist tomorrow regarding chronic progressive knee pain.  She was diagnosed with open left ankle fracture at home with right hand and arm cellulitis and admitted to the hospital.   Subjective:    Ashley Mora today has, No headache, No chest pain, No abdominal pain - No Nausea, No new weakness tingling or numbness, No Cough - SOB.     Assessment  & Plan :     Open ankle fracture Left -  Mechanical fall resulting in ankle fracture, seen by orthopedics underwent surgical correction on 02/18/2018, tolerated the procedure well, no weightbearing, may require another or visit per orthopedics, minimal perioperative blood loss related anemia not requiring transfusion, continue to monitor, will require SNF.  Continue supportive care.  Cellulitis of the L.arm/hand - Patient with cat bite recently but noticed  erythema over the last few days, to the Unasyn continue.  Renal failure -Uncertain baseline creatinine and no documentation available at this time, monitor with holding of ACE inhibitor and gentle hydration.   Chronic respiratory failure due to COPD, on home O2 - Continue home O2, nebulizer treatments as needed.  Stable no acute issues.    HTN - ACE inhibitor on hold, currently on Norvasc, Catapres and Toprol-XL  with good control.   Anemia.  Combination of perioperative blood loss along with dilution, type screen and monitor.    Borderline DM - A1c is satisfactory, and diet-controlled, sliding scale here.  Lab Results  Component Value Date   HGBA1C 5.8 (H) 02/18/2018   CBG (last 3)  Recent Labs    02/18/18 2116 02/19/18 0724 02/19/18 1203  GLUCAP 152* 146* 150*        Diet :  Diet Order            Diet NPO time specified  Diet effective now        Diet Carb Modified Fluid consistency: Thin; Room service appropriate? Yes  Diet effective now               Family Communication  :  None  Code Status :  Full  Disposition Plan  :  SNF  Consults  :  Ortho  Procedures  :    L. Ankle surgery - 02/18/18  DVT Prophylaxis  :   Heparin    Lab Results  Component Value Date   PLT 129 (L) 02/19/2018    Inpatient Medications  Scheduled Meds: . amLODipine  10 mg Oral Daily  . chlorhexidine  60 mL Topical Once  . cloNIDine  0.2 mg Oral BID  . docusate sodium  100 mg Oral BID  . fluticasone furoate-vilanterol  1 puff Inhalation Daily  . heparin injection (subcutaneous)  5,000 Units Subcutaneous Q8H  . insulin aspart  0-9 Units Subcutaneous TID WC  . metoprolol succinate  50 mg Oral Daily  . pantoprazole  40 mg Oral Daily  . povidone-iodine  2 application Topical Once  . umeclidinium bromide  1 puff Inhalation Daily   Continuous Infusions: . ampicillin-sulbactam (UNASYN) IV 1.5 g (02/19/18 1159)  . lactated ringers 10 mL/hr at 02/18/18 1718  . methocarbamol  (ROBAXIN) IV     PRN Meds:.albuterol, ALPRAZolam, fentaNYL (SUBLIMAZE) injection, HYDROcodone-acetaminophen, methocarbamol **OR** methocarbamol (ROBAXIN) IV, metoCLOPramide **OR** metoCLOPramide (REGLAN) injection, morphine injection, ondansetron **OR** ondansetron (ZOFRAN) IV  Antibiotics  :    Anti-infectives (From admission, onward)   Start     Dose/Rate Route Frequency Ordered Stop   02/19/18 0300  ampicillin-sulbactam (UNASYN) 1.5 g in sodium chloride 0.9 % 100 mL IVPB     1.5 g 200 mL/hr over 30 Minutes Intravenous Every 8 hours 02/18/18 1926     02/18/18 1835  vancomycin (VANCOCIN) powder  Status:  Discontinued       As needed 02/18/18 1909 02/18/18 1911   02/18/18 1815  Ampicillin-Sulbactam (UNASYN) 3 g in sodium chloride 0.9 % 100 mL IVPB     3 g 200 mL/hr over 30 Minutes Intravenous To Surgery 02/18/18 1805 02/18/18 1911   02/18/18 1445  ceFAZolin (ANCEF) IVPB 1 g/50 mL premix     1 g 100 mL/hr over 30 Minutes Intravenous  Once 02/18/18 1444 02/18/18 1646         Objective:   Vitals:   02/19/18 0456 02/19/18 0835 02/19/18 0949 02/19/18 1335  BP: (!) 124/58  (!) 117/46 (!) 131/48  Pulse: 67  68 71  Resp: 16   16  Temp: 97.9 F (36.6 C)  98.6 F (37 C) 98.9 F (37.2 C)  TempSrc: Oral  Oral Oral  SpO2: 95% 97% 97% 96%    Wt Readings from Last 3 Encounters:  No data found for Wt     Intake/Output Summary (Last 24 hours) at 02/19/2018 1555 Last data filed  at 02/19/2018 1500 Gross per 24 hour  Intake 1271.33 ml  Output 425 ml  Net 846.33 ml     Physical Exam  Awake Alert, Oriented X 3, No new F.N deficits, Normal affect Otho.AT,PERRAL Supple Neck,No JVD, No cervical lymphadenopathy appriciated.  Symmetrical Chest wall movement, Good air movement bilaterally, CTAB RRR,No Gallops,Rubs or new Murmurs, No Parasternal Heave +ve B.Sounds, Abd Soft, No tenderness, No organomegaly appriciated, No rebound - guarding or rigidity. No Cyanosis, Clubbing or edema, No  new Rash or bruise, R wrist under bandage, L ankle/foot under bandage    Data Review:    CBC Recent Labs  Lab 02/18/18 1404 02/19/18 0818  WBC 26.1* 17.3*  HGB 11.5* 9.7*  HCT 36.0 30.8*  PLT 143* 129*  MCV 94.0 94.2  MCH 30.0 29.7  MCHC 31.9 31.5  RDW 13.9 13.8  LYMPHSABS 1.3  --   MONOABS 2.1*  --   EOSABS 0.0  --   BASOSABS 0.0  --     Chemistries  Recent Labs  Lab 02/18/18 1404 02/19/18 0818  NA 142 142  K 4.0 4.5  CL 107 110  CO2 25 25  GLUCOSE 121* 223*  BUN 40* 35*  CREATININE 1.72* 1.52*  CALCIUM 8.8* 8.3*  MG  --  1.9   ------------------------------------------------------------------------------------------------------------------ No results for input(s): CHOL, HDL, LDLCALC, TRIG, CHOLHDL, LDLDIRECT in the last 72 hours.  Lab Results  Component Value Date   HGBA1C 5.8 (H) 02/18/2018   ------------------------------------------------------------------------------------------------------------------ No results for input(s): TSH, T4TOTAL, T3FREE, THYROIDAB in the last 72 hours.  Invalid input(s): FREET3 ------------------------------------------------------------------------------------------------------------------ No results for input(s): VITAMINB12, FOLATE, FERRITIN, TIBC, IRON, RETICCTPCT in the last 72 hours.  Coagulation profile No results for input(s): INR, PROTIME in the last 168 hours.  No results for input(s): DDIMER in the last 72 hours.  Cardiac Enzymes No results for input(s): CKMB, TROPONINI, MYOGLOBIN in the last 168 hours.  Invalid input(s): CK ------------------------------------------------------------------------------------------------------------------ No results found for: BNP  Micro Results No results found for this or any previous visit (from the past 240 hour(s)).  Radiology Reports Dg Tibia/fibula Left  Result Date: 02/18/2018 CLINICAL DATA:  External fixation of left lower leg. EXAM: LEFT TIBIA AND FIBULA - 2  VIEW; DG C-ARM 61-120 MIN COMPARISON:  02/18/2018 preoperative ankle radiographs. FINDINGS: Fine bony detail is limited by the C-arm fluoroscopic technique. A total of 16 seconds of fluoroscopic time was utilized for external fixation across an open fracture dislocation involving the left tibiotalar joint and malleoli. Improved anatomic alignment despite limited fine bony detail due to the C-arm fluoroscopic technique. IMPRESSION: External fixation across the patient's known trimalleolar fracture dislocation of the ankle joint. Improved alignment with external fixation hardware in place. Electronically Signed   By: Tollie Eth M.D.   On: 02/18/2018 21:20   Dg Chest Portable 1 View  Result Date: 02/18/2018 CLINICAL DATA:  [place] [address] at home, leg injury. EXAM: PORTABLE CHEST 1 VIEW COMPARISON:  Chest radiograph September 04, 2017 FINDINGS: Cardiac silhouette is moderately enlarged. Calcified aortic arch. Mild chronic interstitial changes with LEFT lung base bandlike density. Increased lung volumes. Biapical pleural thickening. No pneumothorax. Osteopenia. Soft tissue planes are non suspicious. IMPRESSION: 1. Increased lung volumes, possible COPD. LEFT lung base atelectasis/scarring. 2. Cardiomegaly. 3.  Aortic Atherosclerosis (ICD10-I70.0). Electronically Signed   By: Awilda Metro M.D.   On: 02/18/2018 14:42   Dg Tibia/fibula Left Port  Result Date: 02/18/2018 CLINICAL DATA:  Post reduction of open left ankle fracture. EXAM: PORTABLE LEFT TIBIA  AND FIBULA - 2 VIEW COMPARISON:  Plain film of the LEFT ankle from earlier same day. FINDINGS: Markedly improved alignment of the osseous structures of the LEFT ankle. Ankle mortise appear symmetric. Overlying casting in place. IMPRESSION: Markedly improved alignment of the osseous structures at the LEFT ankle. Electronically Signed   By: Bary RichardStan  Maynard M.D.   On: 02/18/2018 15:50   Dg Ankle Left Port  Result Date: 02/18/2018 CLINICAL DATA:  Post reduction of open left  ankle fracture. EXAM: PORTABLE LEFT ANKLE - 2 VIEW COMPARISON:  Plain films from earlier same day. FINDINGS: There has been markedly improved alignment of the osseous structures at the LEFT ankle. Ankle mortise now appears symmetric. Visualized portions of the hindfoot and midfoot appear intact and normally aligned. Overlying casting in place. IMPRESSION: Markedly improved alignment of the osseous structures at the LEFT ankle. Casting in place. Electronically Signed   By: Bary RichardStan  Maynard M.D.   On: 02/18/2018 15:51   Dg Ankle Left Port  Result Date: 02/18/2018 CLINICAL DATA:  84 year old female with a history tib-fib fracture after a fall EXAM: PORTABLE LEFT ANKLE - 2 VIEW COMPARISON:  None. FINDINGS: Osteopenia. Lateral talar fracture dislocation with fracture of the medial malleolus, lateral malleolus, and possible posterior malleolar fracture on the lateral view. Soft tissue swelling and subcutaneous gas. IMPRESSION: Open talar fracture dislocation, with plain film evidence of trimalleolar fracture and lateral talar dislocation. Osteopenia. Electronically Signed   By: Gilmer MorJaime  Wagner D.O.   On: 02/18/2018 14:42   Dg C-arm 1-60 Min  Result Date: 02/18/2018 CLINICAL DATA:  External fixation of left lower leg. EXAM: LEFT TIBIA AND FIBULA - 2 VIEW; DG C-ARM 61-120 MIN COMPARISON:  02/18/2018 preoperative ankle radiographs. FINDINGS: Fine bony detail is limited by the C-arm fluoroscopic technique. A total of 16 seconds of fluoroscopic time was utilized for external fixation across an open fracture dislocation involving the left tibiotalar joint and malleoli. Improved anatomic alignment despite limited fine bony detail due to the C-arm fluoroscopic technique. IMPRESSION: External fixation across the patient's known trimalleolar fracture dislocation of the ankle joint. Improved alignment with external fixation hardware in place. Electronically Signed   By: Tollie Ethavid  Kwon M.D.   On: 02/18/2018 21:20    Time Spent in  minutes  30   Susa RaringPrashant Chrystine Frogge M.D on 02/19/2018 at 3:55 PM  To page go to [email or web address] - password [email or web address]

## 2018-02-19 NOTE — Progress Notes (Signed)
Patient stable.  Left leg external fixator in place.  Wound VAC in place.  Plan at this time is to take the patient back to surgery tomorrow for repeat I&D and plate fixation.  All questions answered.  Discontinue subcutaneous heparin today.

## 2018-02-20 ENCOUNTER — Inpatient Hospital Stay (HOSPITAL_COMMUNITY): Payer: Medicare Other

## 2018-02-20 ENCOUNTER — Encounter (HOSPITAL_COMMUNITY)
Admission: EM | Disposition: A | Discharge status: Skilled Nursing Facility | Payer: Self-pay | Source: Home / Self Care | Attending: Internal Medicine

## 2018-02-20 ENCOUNTER — Inpatient Hospital Stay (HOSPITAL_COMMUNITY): Payer: Medicare Other | Admitting: Anesthesiology

## 2018-02-20 ENCOUNTER — Encounter (HOSPITAL_COMMUNITY): Payer: Self-pay | Admitting: Certified Registered Nurse Anesthetist

## 2018-02-20 DIAGNOSIS — S82899A Other fracture of unspecified lower leg, initial encounter for closed fracture: Secondary | ICD-10-CM | POA: Diagnosis present

## 2018-02-20 DIAGNOSIS — S82892B Other fracture of left lower leg, initial encounter for open fracture type I or II: Secondary | ICD-10-CM

## 2018-02-20 HISTORY — PX: EXTERNAL FIXATION REMOVAL: SHX5040

## 2018-02-20 HISTORY — PX: I & D EXTREMITY: SHX5045

## 2018-02-20 HISTORY — PX: ORIF ANKLE FRACTURE: SHX5408

## 2018-02-20 LAB — CBC
HEMATOCRIT: 29.5 % — AB (ref 36.0–46.0)
Hemoglobin: 9.3 g/dL — ABNORMAL LOW (ref 12.0–15.0)
MCH: 29.9 pg (ref 26.0–34.0)
MCHC: 31.5 g/dL (ref 30.0–36.0)
MCV: 94.9 fL (ref 78.0–100.0)
PLATELETS: 137 10*3/uL — AB (ref 150–400)
RBC: 3.11 MIL/uL — AB (ref 3.87–5.11)
RDW: 14.3 % (ref 11.5–15.5)
WBC: 14.3 10*3/uL — AB (ref 4.0–10.5)

## 2018-02-20 LAB — GLUCOSE, CAPILLARY
GLUCOSE-CAPILLARY: 129 mg/dL — AB (ref 70–99)
GLUCOSE-CAPILLARY: 218 mg/dL — AB (ref 70–99)
Glucose-Capillary: 127 mg/dL — ABNORMAL HIGH (ref 70–99)

## 2018-02-20 LAB — BASIC METABOLIC PANEL
Anion gap: 4 — ABNORMAL LOW (ref 5–15)
BUN: 33 mg/dL — AB (ref 8–23)
CO2: 27 mmol/L (ref 22–32)
CREATININE: 1.42 mg/dL — AB (ref 0.44–1.00)
Calcium: 8.5 mg/dL — ABNORMAL LOW (ref 8.9–10.3)
Chloride: 112 mmol/L — ABNORMAL HIGH (ref 98–111)
GFR, EST AFRICAN AMERICAN: 38 mL/min — AB (ref >60)
GFR, EST NON AFRICAN AMERICAN: 33 mL/min — AB (ref >60)
Glucose, Bld: 133 mg/dL — ABNORMAL HIGH (ref 70–99)
POTASSIUM: 4.7 mmol/L (ref 3.5–5.1)
SODIUM: 143 mmol/L (ref 135–145)

## 2018-02-20 SURGERY — REMOVAL, EXTERNAL FIXATION DEVICE, LOWER EXTREMITY
Anesthesia: General | Site: Ankle | Laterality: Left

## 2018-02-20 MED ORDER — ACETAMINOPHEN 325 MG PO TABS
325.0000 mg | ORAL_TABLET | Freq: Four times a day (QID) | ORAL | Status: DC | PRN
  Administered 2018-02-21 – 2018-02-24 (×6): 650 mg via ORAL
  Filled 2018-02-20 (×6): qty 2

## 2018-02-20 MED ORDER — METOCLOPRAMIDE HCL 5 MG/ML IJ SOLN: 5.0000 mg | Freq: Three times a day (TID) | INTRAMUSCULAR | Status: DC | PRN

## 2018-02-20 MED ORDER — ROCURONIUM BROMIDE 10 MG/ML (PF) SYRINGE
PREFILLED_SYRINGE | INTRAVENOUS | Status: DC | PRN
  Administered 2018-02-20: 40 mg via INTRAVENOUS
  Administered 2018-02-20 (×3): 10 mg via INTRAVENOUS

## 2018-02-20 MED ORDER — FENTANYL CITRATE (PF) 100 MCG/2ML IJ SOLN
INTRAMUSCULAR | Status: AC
  Administered 2018-02-20: 50 ug via INTRAVENOUS
  Filled 2018-02-20: qty 2

## 2018-02-20 MED ORDER — MORPHINE SULFATE (PF) 2 MG/ML IV SOLN
0.5000 mg | INTRAVENOUS | Status: DC | PRN
  Filled 2018-02-20: qty 1

## 2018-02-20 MED ORDER — VANCOMYCIN HCL 500 MG IV SOLR
INTRAVENOUS | Status: AC
  Filled 2018-02-20: qty 500

## 2018-02-20 MED ORDER — HYDROCODONE-ACETAMINOPHEN 5-325 MG PO TABS
1.0000 | ORAL_TABLET | Freq: Four times a day (QID) | ORAL | Status: DC | PRN
  Administered 2018-02-21 – 2018-02-25 (×6): 1 via ORAL
  Filled 2018-02-20 (×6): qty 1

## 2018-02-20 MED ORDER — BUPIVACAINE HCL (PF) 0.25 % IJ SOLN: INTRAMUSCULAR | Status: DC | PRN

## 2018-02-20 MED ORDER — ROCURONIUM BROMIDE 50 MG/5ML IV SOSY
PREFILLED_SYRINGE | INTRAVENOUS | Status: AC
  Filled 2018-02-20: qty 5

## 2018-02-20 MED ORDER — LIDOCAINE 2% (20 MG/ML) 5 ML SYRINGE
INTRAMUSCULAR | Status: AC
  Filled 2018-02-20: qty 5

## 2018-02-20 MED ORDER — PHENYLEPHRINE 40 MCG/ML (10ML) SYRINGE FOR IV PUSH (FOR BLOOD PRESSURE SUPPORT)
PREFILLED_SYRINGE | INTRAVENOUS | Status: AC
  Filled 2018-02-20: qty 30

## 2018-02-20 MED ORDER — METOCLOPRAMIDE HCL 5 MG PO TABS: 5.0000 mg | ORAL_TABLET | Freq: Three times a day (TID) | ORAL | Status: DC | PRN

## 2018-02-20 MED ORDER — LACTATED RINGERS IV SOLN
INTRAVENOUS | Status: DC
  Administered 2018-02-20: 14:00:00 via INTRAVENOUS

## 2018-02-20 MED ORDER — FENTANYL CITRATE (PF) 100 MCG/2ML IJ SOLN
INTRAMUSCULAR | Status: DC | PRN
  Administered 2018-02-20: 25 ug via INTRAVENOUS
  Administered 2018-02-20: 50 ug via INTRAVENOUS
  Administered 2018-02-20: 25 ug via INTRAVENOUS

## 2018-02-20 MED ORDER — SODIUM CHLORIDE 0.9 % IV SOLN
INTRAVENOUS | Status: DC | PRN
  Administered 2018-02-20: 25 ug/min via INTRAVENOUS

## 2018-02-20 MED ORDER — GABAPENTIN 300 MG PO CAPS
300.0000 mg | ORAL_CAPSULE | Freq: Three times a day (TID) | ORAL | Status: DC
  Administered 2018-02-20 – 2018-02-25 (×15): 300 mg via ORAL
  Filled 2018-02-20 (×15): qty 1

## 2018-02-20 MED ORDER — DEXAMETHASONE SODIUM PHOSPHATE 10 MG/ML IJ SOLN
INTRAMUSCULAR | Status: DC | PRN
  Administered 2018-02-20: 4 mg via INTRAVENOUS

## 2018-02-20 MED ORDER — FENTANYL CITRATE (PF) 100 MCG/2ML IJ SOLN
25.0000 ug | INTRAMUSCULAR | Status: DC | PRN
  Administered 2018-02-20 (×2): 50 ug via INTRAVENOUS

## 2018-02-20 MED ORDER — BUPIVACAINE HCL (PF) 0.25 % IJ SOLN
INTRAMUSCULAR | Status: AC
  Filled 2018-02-20: qty 30

## 2018-02-20 MED ORDER — BUPIVACAINE-EPINEPHRINE (PF) 0.5% -1:200000 IJ SOLN
INTRAMUSCULAR | Status: DC | PRN
  Administered 2018-02-20: 40 mL via PERINEURAL

## 2018-02-20 MED ORDER — EPHEDRINE 5 MG/ML INJ
INTRAVENOUS | Status: AC
  Filled 2018-02-20: qty 10

## 2018-02-20 MED ORDER — 0.9 % SODIUM CHLORIDE (POUR BTL) OPTIME
TOPICAL | Status: DC | PRN
  Administered 2018-02-20: 2000 mL
  Administered 2018-02-20: 1000 mL
  Administered 2018-02-20: 2000 mL
  Administered 2018-02-20: 1000 mL

## 2018-02-20 MED ORDER — VANCOMYCIN HCL 1000 MG IV SOLR
INTRAVENOUS | Status: AC
  Filled 2018-02-20: qty 1000

## 2018-02-20 MED ORDER — ONDANSETRON HCL 4 MG/2ML IJ SOLN
INTRAMUSCULAR | Status: AC
  Filled 2018-02-20: qty 2

## 2018-02-20 MED ORDER — DEXAMETHASONE SODIUM PHOSPHATE 10 MG/ML IJ SOLN
INTRAMUSCULAR | Status: AC
  Filled 2018-02-20: qty 2

## 2018-02-20 MED ORDER — PROPOFOL 10 MG/ML IV BOLUS
INTRAVENOUS | Status: DC | PRN
  Administered 2018-02-20: 80 mg via INTRAVENOUS

## 2018-02-20 MED ORDER — PROPOFOL 10 MG/ML IV BOLUS
INTRAVENOUS | Status: AC
  Filled 2018-02-20: qty 20

## 2018-02-20 MED ORDER — FENTANYL CITRATE (PF) 250 MCG/5ML IJ SOLN
INTRAMUSCULAR | Status: AC
  Filled 2018-02-20: qty 5

## 2018-02-20 MED ORDER — RIVAROXABAN 10 MG PO TABS
10.0000 mg | ORAL_TABLET | Freq: Every day | ORAL | Status: DC
  Administered 2018-02-20 – 2018-02-24 (×5): 10 mg via ORAL
  Filled 2018-02-20 (×5): qty 1

## 2018-02-20 MED ORDER — LIDOCAINE HCL (CARDIAC) PF 100 MG/5ML IV SOSY
PREFILLED_SYRINGE | INTRAVENOUS | Status: DC | PRN
  Administered 2018-02-20: 70 mg via INTRAVENOUS

## 2018-02-20 MED ORDER — METOPROLOL TARTRATE 5 MG/5ML IV SOLN
INTRAVENOUS | Status: AC
  Filled 2018-02-20: qty 5

## 2018-02-20 MED ORDER — HYDROCODONE-ACETAMINOPHEN 5-325 MG PO TABS: 1.0000 | ORAL_TABLET | ORAL | Status: DC | PRN

## 2018-02-20 MED ORDER — ONDANSETRON HCL 4 MG/2ML IJ SOLN: 4.0000 mg | Freq: Four times a day (QID) | INTRAMUSCULAR | Status: DC | PRN

## 2018-02-20 MED ORDER — METHOCARBAMOL 500 MG PO TABS
ORAL_TABLET | ORAL | Status: AC
  Filled 2018-02-20: qty 1

## 2018-02-20 MED ORDER — ONDANSETRON HCL 4 MG/2ML IJ SOLN: 4.0000 mg | Freq: Once | INTRAMUSCULAR | Status: DC | PRN

## 2018-02-20 MED ORDER — PHENYLEPHRINE 40 MCG/ML (10ML) SYRINGE FOR IV PUSH (FOR BLOOD PRESSURE SUPPORT)
PREFILLED_SYRINGE | INTRAVENOUS | Status: DC | PRN
  Administered 2018-02-20: 80 ug via INTRAVENOUS

## 2018-02-20 MED ORDER — ONDANSETRON HCL 4 MG/2ML IJ SOLN
INTRAMUSCULAR | Status: DC | PRN
  Administered 2018-02-20: 4 mg via INTRAVENOUS

## 2018-02-20 MED ORDER — SUGAMMADEX SODIUM 200 MG/2ML IV SOLN
INTRAVENOUS | Status: DC | PRN
  Administered 2018-02-20: 200 mg via INTRAVENOUS

## 2018-02-20 MED ORDER — EPHEDRINE SULFATE-NACL 50-0.9 MG/10ML-% IV SOSY
PREFILLED_SYRINGE | INTRAVENOUS | Status: DC | PRN
  Administered 2018-02-20 (×3): 10 mg via INTRAVENOUS

## 2018-02-20 MED ORDER — LACTATED RINGERS IV SOLN
INTRAVENOUS | Status: DC | PRN
  Administered 2018-02-20 (×2): via INTRAVENOUS

## 2018-02-20 MED ORDER — ONDANSETRON HCL 4 MG PO TABS: 4.0000 mg | ORAL_TABLET | Freq: Four times a day (QID) | ORAL | Status: DC | PRN

## 2018-02-20 MED ORDER — DOCUSATE SODIUM 100 MG PO CAPS: 100.0000 mg | ORAL_CAPSULE | Freq: Two times a day (BID) | ORAL | Status: DC

## 2018-02-20 MED ORDER — METOPROLOL TARTRATE 5 MG/5ML IV SOLN
INTRAVENOUS | Status: DC | PRN
  Administered 2018-02-20: 1 mg via INTRAVENOUS

## 2018-02-20 MED ORDER — VANCOMYCIN HCL 500 MG IV SOLR
INTRAVENOUS | Status: DC | PRN
  Administered 2018-02-20: 500 mg via TOPICAL

## 2018-02-20 SURGICAL SUPPLY — 105 items
ALCOHOL 70% 16 OZ (MISCELLANEOUS) ×3 IMPLANT
BANDAGE ACE 4X5 VEL STRL LF (GAUZE/BANDAGES/DRESSINGS) ×3 IMPLANT
BANDAGE ACE 6X5 VEL STRL LF (GAUZE/BANDAGES/DRESSINGS) ×3 IMPLANT
BANDAGE ESMARK 6X9 LF (GAUZE/BANDAGES/DRESSINGS) IMPLANT
BIT DRILL 2.5X2.75 QC CALB (BIT) ×3 IMPLANT
BIT DRILL 2.9 CANN QC NONSTRL (BIT) ×3 IMPLANT
BLADE SURG 10 STRL SS (BLADE) IMPLANT
BNDG COHESIVE 4X5 TAN STRL (GAUZE/BANDAGES/DRESSINGS) IMPLANT
BNDG COHESIVE 6X5 TAN STRL LF (GAUZE/BANDAGES/DRESSINGS) IMPLANT
BNDG ELASTIC 6X10 VLCR STRL LF (GAUZE/BANDAGES/DRESSINGS) IMPLANT
BNDG ESMARK 4X9 LF (GAUZE/BANDAGES/DRESSINGS) ×3 IMPLANT
BNDG ESMARK 6X9 LF (GAUZE/BANDAGES/DRESSINGS)
BNDG GAUZE ELAST 4 BULKY (GAUZE/BANDAGES/DRESSINGS) IMPLANT
COVER MAYO STAND STRL (DRAPES) IMPLANT
COVER SURGICAL LIGHT HANDLE (MISCELLANEOUS) ×3 IMPLANT
CUFF TOURNIQUET SINGLE 18IN (TOURNIQUET CUFF) IMPLANT
CUFF TOURNIQUET SINGLE 24IN (TOURNIQUET CUFF) IMPLANT
CUFF TOURNIQUET SINGLE 34IN LL (TOURNIQUET CUFF) IMPLANT
CUFF TOURNIQUET SINGLE 44IN (TOURNIQUET CUFF) IMPLANT
DRAPE C-ARM 42X72 X-RAY (DRAPES) ×3 IMPLANT
DRAPE HALF SHEET 40X57 (DRAPES) IMPLANT
DRAPE INCISE IOBAN 66X45 STRL (DRAPES) IMPLANT
DRAPE ORTHO SPLIT 77X108 STRL (DRAPES) ×4
DRAPE SURG 17X23 STRL (DRAPES) ×3 IMPLANT
DRAPE SURG ORHT 6 SPLT 77X108 (DRAPES) ×2 IMPLANT
DRAPE U-SHAPE 47X51 STRL (DRAPES) ×3 IMPLANT
DRESSING PEEL AND PLAC PRVNA20 (GAUZE/BANDAGES/DRESSINGS) ×1 IMPLANT
DRESSING PEEL AND PLC PRVNA 13 (GAUZE/BANDAGES/DRESSINGS) ×2 IMPLANT
DRSG EMULSION OIL 3X3 NADH (GAUZE/BANDAGES/DRESSINGS) IMPLANT
DRSG PAD ABDOMINAL 8X10 ST (GAUZE/BANDAGES/DRESSINGS) IMPLANT
DRSG PEEL AND PLACE PREVENA 13 (GAUZE/BANDAGES/DRESSINGS) ×6
DRSG PEEL AND PLACE PREVENA 20 (GAUZE/BANDAGES/DRESSINGS) ×3
DRSG VAC ATS SM SENSATRAC (GAUZE/BANDAGES/DRESSINGS) ×6 IMPLANT
DURAPREP 26ML APPLICATOR (WOUND CARE) IMPLANT
ELECT CAUTERY BLADE 6.4 (BLADE) ×3 IMPLANT
ELECT REM PT RETURN 9FT ADLT (ELECTROSURGICAL) ×3
ELECTRODE REM PT RTRN 9FT ADLT (ELECTROSURGICAL) ×1 IMPLANT
GAUZE SPONGE 4X4 12PLY STRL (GAUZE/BANDAGES/DRESSINGS) ×3 IMPLANT
GAUZE XEROFORM 1X8 LF (GAUZE/BANDAGES/DRESSINGS) ×3 IMPLANT
GAUZE XEROFORM 5X9 LF (GAUZE/BANDAGES/DRESSINGS) IMPLANT
GLOVE BIOGEL PI IND STRL 7.5 (GLOVE) ×1 IMPLANT
GLOVE BIOGEL PI IND STRL 8 (GLOVE) ×1 IMPLANT
GLOVE BIOGEL PI INDICATOR 7.5 (GLOVE) ×2
GLOVE BIOGEL PI INDICATOR 8 (GLOVE) ×2
GLOVE ECLIPSE 7.0 STRL STRAW (GLOVE) IMPLANT
GLOVE SURG ORTHO 8.0 STRL STRW (GLOVE) ×6 IMPLANT
GOWN STRL REUS W/ TWL LRG LVL3 (GOWN DISPOSABLE) ×3 IMPLANT
GOWN STRL REUS W/ TWL XL LVL3 (GOWN DISPOSABLE) ×1 IMPLANT
GOWN STRL REUS W/TWL LRG LVL3 (GOWN DISPOSABLE) ×6
GOWN STRL REUS W/TWL XL LVL3 (GOWN DISPOSABLE) ×2
HANDPIECE INTERPULSE COAX TIP (DISPOSABLE)
K-WIRE ACE 1.6X6 (WIRE) ×9
KIT BASIN OR (CUSTOM PROCEDURE TRAY) ×3 IMPLANT
KIT TURNOVER KIT B (KITS) ×3 IMPLANT
KWIRE ACE 1.6X6 (WIRE) ×3 IMPLANT
MANIFOLD NEPTUNE II (INSTRUMENTS) ×3 IMPLANT
NEEDLE HYPO 25GX1X1/2 BEV (NEEDLE) ×3 IMPLANT
NS IRRIG 1000ML POUR BTL (IV SOLUTION) ×12 IMPLANT
PACK GENERAL/GYN (CUSTOM PROCEDURE TRAY) ×3 IMPLANT
PACK ORTHO EXTREMITY (CUSTOM PROCEDURE TRAY) ×3 IMPLANT
PAD ABD 8X10 STRL (GAUZE/BANDAGES/DRESSINGS) ×3 IMPLANT
PAD ARMBOARD 7.5X6 YLW CONV (MISCELLANEOUS) ×6 IMPLANT
PAD CAST 4YDX4 CTTN HI CHSV (CAST SUPPLIES) ×1 IMPLANT
PADDING CAST COTTON 4X4 STRL (CAST SUPPLIES) ×2
PADDING CAST COTTON 6X4 STRL (CAST SUPPLIES) ×6 IMPLANT
PLATE LOCK 3H 95 LT DIST FIB (Plate) ×3 IMPLANT
SCREW ACE CAN 4.0 40M (Screw) ×6 IMPLANT
SCREW LOCK CORT STAR 3.5X10 (Screw) ×3 IMPLANT
SCREW LOCK CORT STAR 3.5X14 (Screw) ×6 IMPLANT
SCREW LOCK CORT STAR 3.5X16 (Screw) ×9 IMPLANT
SCREW NON LOCKING LP 3.5 14MM (Screw) ×6 IMPLANT
SCREW NON LOCKING LP 3.5 16MM (Screw) ×3 IMPLANT
SET HNDPC FAN SPRY TIP SCT (DISPOSABLE) IMPLANT
SPLINT PLASTER CAST XFAST 5X30 (CAST SUPPLIES) ×1 IMPLANT
SPLINT PLASTER XFAST SET 5X30 (CAST SUPPLIES) ×2
SPONGE LAP 18X18 X RAY DECT (DISPOSABLE) IMPLANT
SPONGE LAP 4X18 RFD (DISPOSABLE) IMPLANT
STAPLER VISISTAT 35W (STAPLE) ×3 IMPLANT
STOCKINETTE IMPERVIOUS 9X36 MD (GAUZE/BANDAGES/DRESSINGS) IMPLANT
SUCTION FRAZIER HANDLE 10FR (MISCELLANEOUS) ×2
SUCTION TUBE FRAZIER 10FR DISP (MISCELLANEOUS) ×1 IMPLANT
SUT ETHILON 2 0 FS 18 (SUTURE) IMPLANT
SUT ETHILON 3 0 PS 1 (SUTURE) ×21 IMPLANT
SUT ETHILON 4 0 FS 1 (SUTURE) IMPLANT
SUT MNCRL AB 3-0 PS2 18 (SUTURE) IMPLANT
SUT VIC AB 0 CT1 27 (SUTURE)
SUT VIC AB 0 CT1 27XBRD ANBCTR (SUTURE) IMPLANT
SUT VIC AB 2-0 CT1 27 (SUTURE)
SUT VIC AB 2-0 CT1 TAPERPNT 27 (SUTURE) IMPLANT
SUT VIC AB 2-0 CTB1 (SUTURE) ×15 IMPLANT
SUT VIC AB 3-0 SH 27 (SUTURE) ×4
SUT VIC AB 3-0 SH 27X BRD (SUTURE) ×2 IMPLANT
SWAB CULTURE ESWAB REG 1ML (MISCELLANEOUS) IMPLANT
SWAB CULTURE LIQ STUART DBL (MISCELLANEOUS) IMPLANT
SYR CONTROL 10ML LL (SYRINGE) ×3 IMPLANT
TOWEL OR 17X24 6PK STRL BLUE (TOWEL DISPOSABLE) ×3 IMPLANT
TOWEL OR 17X26 10 PK STRL BLUE (TOWEL DISPOSABLE) ×3 IMPLANT
TUBE CONNECTING 12'X1/4 (SUCTIONS)
TUBE CONNECTING 12X1/4 (SUCTIONS) IMPLANT
UNDERPAD 30X30 (UNDERPADS AND DIAPERS) ×3 IMPLANT
WASHER FLAT ACE (Orthopedic Implant) ×4 IMPLANT
WASHER PLAIN FLAT ACE NS 3PK (Orthopedic Implant) ×2 IMPLANT
WATER STERILE IRR 1000ML POUR (IV SOLUTION) ×3 IMPLANT
WND VAC CANISTER 500ML (MISCELLANEOUS) ×3 IMPLANT
YANKAUER SUCT BULB TIP NO VENT (SUCTIONS) ×3 IMPLANT

## 2018-02-20 NOTE — Anesthesia Procedure Notes (Signed)
Anesthesia Regional Block: Popliteal block   Pre-Anesthetic Checklist: ,, timeout performed, Correct Patient, Correct Site, Correct Laterality, Correct Procedure, Correct Position, site marked, Risks and benefits discussed,  Surgical consent,  Pre-op evaluation,  At surgeon's request and post-op pain management  Laterality: Left  Prep: chloraprep       Needles:  Injection technique: Single-shot  Needle Type: Echogenic Needle     Needle Length: 9cm  Needle Gauge: 21     Additional Needles:   Procedures:,,,, ultrasound used (permanent image in chart),,,,  Narrative:  Start time: 02/20/2018 7:15 AM End time: 02/20/2018 7:20 AM Injection made incrementally with aspirations every 5 mL.  Performed by: Personally  Anesthesiologist: Cecile Hearingurk, Allexis Bordenave Edward, MD  Additional Notes: No pain on injection. No increased resistance to injection. Injection made in 5cc increments.  Good needle visualization.  Patient tolerated procedure well.

## 2018-02-20 NOTE — Anesthesia Procedure Notes (Signed)
Procedure Name: Intubation Date/Time: 02/20/2018 8:06 AM Performed by: Carney Living, CRNA Pre-anesthesia Checklist: Patient identified, Emergency Drugs available, Suction available, Patient being monitored and Timeout performed Patient Re-evaluated:Patient Re-evaluated prior to induction Oxygen Delivery Method: Circle system utilized Preoxygenation: Pre-oxygenation with 100% oxygen Induction Type: IV induction Ventilation: Mask ventilation without difficulty Laryngoscope Size: Mac and 4 Grade View: Grade I Tube type: Oral Tube size: 7.0 mm Number of attempts: 1 Airway Equipment and Method: Stylet Placement Confirmation: ETT inserted through vocal cords under direct vision,  positive ETCO2 and breath sounds checked- equal and bilateral Secured at: 23 cm Tube secured with: Tape Dental Injury: Teeth and Oropharynx as per pre-operative assessment

## 2018-02-20 NOTE — Brief Op Note (Signed)
02/20/2018  10:44 AM  PATIENT:  Charleston RopesVirgina Skoda  84 y.o. female  PRE-OPERATIVE DIAGNOSIS:  ankle fracture left  POST-OPERATIVE DIAGNOSIS:  ankle fracture left  PROCEDURE:  Procedure(s): REMOVAL EXTERNAL FIXATION LEG IRRIGATION AND excisional  DEBRIDEMENT LEG OPEN REDUCTION INTERNAL FIXATION (ORIF) ANKLE FRACTURE Application wound vac Closure medial incision  SURGEON:  Surgeon(s): Cammy Copaean, Gregory Scott, MD  ASSISTANT: none  ANESTHESIA:   general  EBL: 20 ml    Total I/O In: 1000 [I.V.:1000] Out: 75 [Blood:75]  BLOOD ADMINISTERED: none  DRAINS: incisional wound vac x 2   LOCAL MEDICATIONS USED:  none  SPECIMEN:  No Specimen  COUNTS:  YES  TOURNIQUET:  * No tourniquets in log *  DICTATION: .Other Dictation: Dictation Number [id number]  PLAN OF CARE: Admit to inpatient   PATIENT DISPOSITION:  PACU - hemodynamically stable

## 2018-02-20 NOTE — Progress Notes (Signed)
Patient ID: Ashley RopesVirgina Mora, female   DOB: [birth year], 84 y.o.   MRN: [id number]     Subjective:  Patient intubated for surgery with Dr August Saucerean for ORIF left ankle.  Timeout observed and Dr August Saucerean preforming ORIF of left ankle and I changed dressing on the right hand  Objective:   VITALS:   Vitals:   02/19/18 0949 02/19/18 1335 02/19/18 2140 02/20/18 0542  BP: (!) 117/46 (!) 131/48 (!) 146/45 (!) 163/58  Pulse: 68 71 70 68  Resp:  16  15  Temp: 98.6 F (37 C) 98.9 F (37.2 C) 98.1 F (36.7 C) 97.8 F (36.6 C)  TempSrc: Oral Oral Oral Oral  SpO2: 97% 96% 94% 93%    Hand wound improving  Drain removed Sutures still in place Dry dressing reapplied to right hand   Lab Results  Component Value Date   WBC 14.3 (H) 02/20/2018   HGB 9.3 (L) 02/20/2018   HCT 29.5 (L) 02/20/2018   MCV 94.9 02/20/2018   PLT 137 (L) 02/20/2018   BMET    Component Value Date/Time   NA 143 02/20/2018 0358   K 4.7 02/20/2018 0358   CL 112 (H) 02/20/2018 0358   CO2 27 02/20/2018 0358   GLUCOSE 133 (H) 02/20/2018 0358   BUN 33 (H) 02/20/2018 0358   CREATININE 1.42 (H) 02/20/2018 0358   CALCIUM 8.5 (L) 02/20/2018 0358   GFRNONAA 33 (L) 02/20/2018 0358   GFRAA 38 (L) 02/20/2018 0358     Assessment/Plan: Day of Surgery   Principal Problem:   Open ankle fracture, left, type I or II, initial encounter Active Problems:   COPD (chronic obstructive pulmonary disease) (HCC)   Borderline diabetes mellitus   Essential hypertension   Pasteurella cellulitis due to cat bite   Renal failure   Tibial fracture   Continue plan per Dr August Saucerean for left ankle Continue dry dressing to right hand  WBAT right upper ext     Ashley Mora 02/20/2018, 9:53 AM   Ashley LucyJoshua Landau, MD Cell [phone]

## 2018-02-20 NOTE — Progress Notes (Signed)
@IPLOG @        PROGRESS NOTE                                                                                                                                                                                                             Patient Demographics:    Ashley Mora, is a 84 y.o. female, DOB - 05-06-1933, WUJ:[id number]  Admit date - 02/18/2018   Admitting Physician Jonah Blue, MD  Outpatient Primary MD for the patient is [name], MD  LOS - 2  Chief Complaint  Patient presents with  . Fall  . tib fib fx       Brief Narrative  Ashley Mora is a 84 y.o. female with medical history significant of  COPD on 3L home O2; preDM; HTN presenting with open fracture/dislocation.  She was going into her house and she tripped over the threshold and fell.  She landed on the steps and had an open fracture.  She had a similar fracture on the right about 5 years ago.  She also had a cat bite.  She jerked her hand when she bit.  It looked like it was healing and about 3-4 days ago it started swelling.  She did see the doctor about this today.  She has an appointment with the orthopedist tomorrow regarding chronic progressive knee pain.  She was diagnosed with open left ankle fracture at home with right hand and arm cellulitis and admitted to the hospital.   Subjective:   Patient in bed, appears comfortable, denies any headache, no fever, no chest pain or pressure, no shortness of breath , no abdominal pain. No focal weakness.   Assessment  & Plan :     Open ankle fracture Left -  Mechanical fall resulting in ankle fracture, seen by orthopedics she initially had external fixators placed on 02/18/2018 now underwent surgical correction on 02/20/2018, tolerated the procedure well, no weightbearing, monitor H&H, continue to monitor clinically, will require SNF.  Continue supportive care.  Cellulitis of the L.arm/hand - Patient with cat bite recently but noticed erythema over the last few  days, to the Unasyn continue.  Renal failure -Uncertain baseline creatinine and no documentation available at this time, monitor with holding of ACE inhibitor and gentle hydration.   Chronic respiratory failure due to COPD, on home O2 - Continue home O2, nebulizer treatments as needed.  Stable no acute issues.    HTN - ACE inhibitor on hold, currently on Norvasc, Catapres and Toprol-XL with good control.   Anemia.  Combination of perioperative blood loss along with dilution, type screen and monitor.    Borderline DM - A1c is satisfactory, and diet-controlled, sliding scale here.  Lab Results  Component Value Date   HGBA1C 5.8 (H) 02/18/2018   CBG (last 3)  Recent Labs    02/19/18 1646 02/19/18 2137 02/20/18 1054  GLUCAP 153* 155* 129*     Diet :  Diet Order            Diet Carb Modified Fluid consistency: Thin; Room service appropriate? Yes  Diet effective now               Family Communication  :  None  Code Status :  Full  Disposition Plan  :  SNF  Consults  :  Ortho  Procedures  :    L. Ankle surgery - 02/18/18  DVT Prophylaxis  :   Heparin    Lab Results  Component Value Date   PLT 137 (L) 02/20/2018    Inpatient Medications  Scheduled Meds: . amLODipine  10 mg Oral Daily  . chlorhexidine  60 mL Topical Once  . cloNIDine  0.2 mg Oral BID  . docusate sodium  100 mg Oral BID  . fluticasone furoate-vilanterol  1 puff Inhalation Daily  . gabapentin  300 mg Oral TID  . insulin aspart  0-9 Units Subcutaneous TID WC  . methocarbamol      . metoprolol succinate  50 mg Oral Daily  . pantoprazole  40 mg Oral Daily  . povidone-iodine  2 application Topical Once  . rivaroxaban  10 mg Oral Daily  . umeclidinium bromide  1 puff Inhalation Daily   Continuous Infusions: . ampicillin-sulbactam (UNASYN) IV Stopped (02/20/18 1200)  . lactated ringers 10 mL/hr at 02/18/18 1718   PRN Meds:.[START ON 02/21/2018] acetaminophen, albuterol, ALPRAZolam, fentaNYL  (SUBLIMAZE) injection, HYDROcodone-acetaminophen, morphine injection, [DISCONTINUED] ondansetron **OR** ondansetron (ZOFRAN) IV  Antibiotics  :    Anti-infectives (From admission, onward)   Start     Dose/Rate Route Frequency Ordered Stop   02/20/18 0902  vancomycin (VANCOCIN) powder  Status:  Discontinued       As needed 02/20/18 0902 02/20/18 1052   02/19/18 0300  ampicillin-sulbactam (UNASYN) 1.5 g in sodium chloride 0.9 % 100 mL IVPB     1.5 g 200 mL/hr over 30 Minutes Intravenous Every 8 hours 02/18/18 1926     02/18/18 1835  vancomycin (VANCOCIN) powder  Status:  Discontinued       As needed 02/18/18 1909 02/18/18 1911   02/18/18 1815  Ampicillin-Sulbactam (UNASYN) 3 g in sodium chloride 0.9 % 100 mL IVPB     3 g 200 mL/hr over 30 Minutes Intravenous To Surgery 02/18/18 1805 02/18/18 1911   02/18/18 1445  ceFAZolin (ANCEF) IVPB 1 g/50 mL premix     1 g 100 mL/hr over 30 Minutes Intravenous  Once 02/18/18 1444 02/18/18 1646         Objective:   Vitals:   02/20/18 1155 02/20/18 1210 02/20/18 1215 02/20/18 1225  BP: (!) 162/62 (!) 150/57  (!) 151/63  Pulse: 75 65 69 69  Resp: 15 13 13 14   Temp:   (!) 97.3 F (36.3 C) (!) 97.3 F (36.3 C)  TempSrc:      SpO2: 97% 97% 97% 100%    Wt Readings from Last 3 Encounters:  No data found for Wt     Intake/Output Summary (Last 24 hours) at 02/20/2018 1403 Last data filed at 02/20/2018  1215 Gross per 24 hour  Intake 1930 ml  Output 1125 ml  Net 805 ml     Physical Exam  Awake Alert, Oriented X 3, No new F.N deficits, Normal affect [place].AT,PERRAL Supple Neck,No JVD, No cervical lymphadenopathy appriciated.  Symmetrical Chest wall movement, Good air movement bilaterally, CTAB RRR,No Gallops, Rubs or new Murmurs, No Parasternal Heave +ve B.Sounds, Abd Soft, No tenderness, No organomegaly appriciated, No rebound - guarding or rigidity. No Cyanosis, Clubbing or edema, No new Rash or bruise R wrist under bandage, L ankle/foot  under bandage    Data Review:    CBC Recent Labs  Lab 02/18/18 1404 02/19/18 0818 02/20/18 0358  WBC 26.1* 17.3* 14.3*  HGB 11.5* 9.7* 9.3*  HCT 36.0 30.8* 29.5*  PLT 143* 129* 137*  MCV 94.0 94.2 94.9  MCH 30.0 29.7 29.9  MCHC 31.9 31.5 31.5  RDW 13.9 13.8 14.3  LYMPHSABS 1.3  --   --   MONOABS 2.1*  --   --   EOSABS 0.0  --   --   BASOSABS 0.0  --   --     Chemistries  Recent Labs  Lab 02/18/18 1404 02/19/18 0818 02/20/18 0358  NA 142 142 143  K 4.0 4.5 4.7  CL 107 110 112*  CO2 25 25 27   GLUCOSE 121* 223* 133*  BUN 40* 35* 33*  CREATININE 1.72* 1.52* 1.42*  CALCIUM 8.8* 8.3* 8.5*  MG  --  1.9  --    ------------------------------------------------------------------------------------------------------------------ No results for input(s): CHOL, HDL, LDLCALC, TRIG, CHOLHDL, LDLDIRECT in the last 72 hours.  Lab Results  Component Value Date   HGBA1C 5.8 (H) 02/18/2018   ------------------------------------------------------------------------------------------------------------------ No results for input(s): TSH, T4TOTAL, T3FREE, THYROIDAB in the last 72 hours.  Invalid input(s): FREET3 ------------------------------------------------------------------------------------------------------------------ No results for input(s): VITAMINB12, FOLATE, FERRITIN, TIBC, IRON, RETICCTPCT in the last 72 hours.  Coagulation profile No results for input(s): INR, PROTIME in the last 168 hours.  No results for input(s): DDIMER in the last 72 hours.  Cardiac Enzymes No results for input(s): CKMB, TROPONINI, MYOGLOBIN in the last 168 hours.  Invalid input(s): CK ------------------------------------------------------------------------------------------------------------------ No results found for: BNP  Micro Results Recent Results (from the past 240 hour(s))  Surgical pcr screen     Status: None   Collection Time: 02/19/18  5:08 PM  Result Value Ref Range Status    MRSA, PCR NEGATIVE NEGATIVE Final   Staphylococcus aureus NEGATIVE NEGATIVE Final    Comment: (NOTE) The Xpert SA Assay (FDA approved for NASAL specimens in patients 13 years of age and older), is one component of a comprehensive surveillance program. It is not intended to diagnose infection nor to guide or monitor treatment. Performed at [facility], [address], [place], [place] [address]     Radiology Reports Dg Tibia/fibula Left  Result Date: 02/18/2018 CLINICAL DATA:  External fixation of left lower leg. EXAM: LEFT TIBIA AND FIBULA - 2 VIEW; DG C-ARM 61-120 MIN COMPARISON:  02/18/2018 preoperative ankle radiographs. FINDINGS: Fine bony detail is limited by the C-arm fluoroscopic technique. A total of 16 seconds of fluoroscopic time was utilized for external fixation across an open fracture dislocation involving the left tibiotalar joint and malleoli. Improved anatomic alignment despite limited fine bony detail due to the C-arm fluoroscopic technique. IMPRESSION: External fixation across the patient's known trimalleolar fracture dislocation of the ankle joint. Improved alignment with external fixation hardware in place. Electronically Signed   By: Rene Kocher.D.  On: 02/18/2018 21:20   Dg Ankle 2 Views Left  Result Date: 02/20/2018 CLINICAL DATA:  84 year old female undergoing ORIF of bimalleolar fracture. EXAM: LEFT ANKLE - 2 VIEW; DG C-ARM 61-120 MIN COMPARISON:  Prior radiographs 02/18/2018 FINDINGS: Three intraoperative spot radiographs demonstrate removal of external fixation and ORIF of the bimalleolar fracture with application of a lateral plate and screw construct along the distal fibula, and placement of 2 cannulated lag screws complete with washers across the medial malleolar fracture. Irregularity of the soft tissues overlying the medial malleolus consistent with an open wound. IMPRESSION: ORIF of bimalleolar fracture as above. No evidence of immediate hardware  complication. The previously noted external fixator has been removed. Electronically Signed   By: Malachy Moan M.D.   On: 02/20/2018 11:53   Dg Chest Portable 1 View  Result Date: 02/18/2018 CLINICAL DATA:  Larey Seat at home, leg injury. EXAM: PORTABLE CHEST 1 VIEW COMPARISON:  Chest radiograph September 04, 2017 FINDINGS: Cardiac silhouette is moderately enlarged. Calcified aortic arch. Mild chronic interstitial changes with LEFT lung base bandlike density. Increased lung volumes. Biapical pleural thickening. No pneumothorax. Osteopenia. Soft tissue planes are non suspicious. IMPRESSION: 1. Increased lung volumes, possible COPD. LEFT lung base atelectasis/scarring. 2. Cardiomegaly. 3.  Aortic Atherosclerosis (ICD10-I70.0). Electronically Signed   By: Awilda Metro M.D.   On: 02/18/2018 14:42   Dg Tibia/fibula Left Port  Result Date: 02/18/2018 CLINICAL DATA:  Post reduction of open left ankle fracture. EXAM: PORTABLE LEFT TIBIA AND FIBULA - 2 VIEW COMPARISON:  Plain film of the LEFT ankle from earlier same day. FINDINGS: Markedly improved alignment of the osseous structures of the LEFT ankle. Ankle mortise appear symmetric. Overlying casting in place. IMPRESSION: Markedly improved alignment of the osseous structures at the LEFT ankle. Electronically Signed   By: Bary Richard M.D.   On: 02/18/2018 15:50   Dg Ankle Left Port  Result Date: 02/20/2018 CLINICAL DATA:  85 year old female status post ORIF of bimalleolar fracture EXAM: PORTABLE LEFT ANKLE - 2 VIEW COMPARISON:  Intraoperative radiographs obtained earlier today FINDINGS: Interval surgical changes of ORIF of a distal fibular fracture with a lateral buttress plate and screw construct. No evidence of hardware complication. The medial malleolar fracture has been transfixed with 2 cannulated lag screws 1 of which passes through a washer. Alignment of the fracture fragments is significantly improved. A wound VAC is present as well as plaster splinting  material. IMPRESSION: ORIF of a bimalleolar fracture without evidence of immediate hardware complication. Electronically Signed   By: Malachy Moan M.D.   On: 02/20/2018 11:54   Dg Ankle Left Port  Result Date: 02/18/2018 CLINICAL DATA:  Post reduction of open left ankle fracture. EXAM: PORTABLE LEFT ANKLE - 2 VIEW COMPARISON:  Plain films from earlier same day. FINDINGS: There has been markedly improved alignment of the osseous structures at the LEFT ankle. Ankle mortise now appears symmetric. Visualized portions of the hindfoot and midfoot appear intact and normally aligned. Overlying casting in place. IMPRESSION: Markedly improved alignment of the osseous structures at the LEFT ankle. Casting in place. Electronically Signed   By: Bary Richard M.D.   On: 02/18/2018 15:51   Dg Ankle Left Port  Result Date: 02/18/2018 CLINICAL DATA:  84 year old female with a history tib-fib fracture after a fall EXAM: PORTABLE LEFT ANKLE - 2 VIEW COMPARISON:  None. FINDINGS: Osteopenia. Lateral talar fracture dislocation with fracture of the medial malleolus, lateral malleolus, and possible posterior malleolar fracture on the lateral view. Soft tissue swelling  and subcutaneous gas. IMPRESSION: Open talar fracture dislocation, with plain film evidence of trimalleolar fracture and lateral talar dislocation. Osteopenia. Electronically Signed   By: Gilmer MorJaime  Wagner D.O.   On: 02/18/2018 14:42   Dg C-arm 1-60 Min  Result Date: 02/20/2018 CLINICAL DATA:  84 year old female undergoing ORIF of bimalleolar fracture. EXAM: LEFT ANKLE - 2 VIEW; DG C-ARM 61-120 MIN COMPARISON:  Prior radiographs 02/18/2018 FINDINGS: Three intraoperative spot radiographs demonstrate removal of external fixation and ORIF of the bimalleolar fracture with application of a lateral plate and screw construct along the distal fibula, and placement of 2 cannulated lag screws complete with washers across the medial malleolar fracture. Irregularity of the  soft tissues overlying the medial malleolus consistent with an open wound. IMPRESSION: ORIF of bimalleolar fracture as above. No evidence of immediate hardware complication. The previously noted external fixator has been removed. Electronically Signed   By: Malachy MoanHeath  McCullough M.D.   On: 02/20/2018 11:53   Dg C-arm 1-60 Min  Result Date: 02/20/2018 CLINICAL DATA:  84 year old female undergoing ORIF of bimalleolar fracture. EXAM: LEFT ANKLE - 2 VIEW; DG C-ARM 61-120 MIN COMPARISON:  Prior radiographs 02/18/2018 FINDINGS: Three intraoperative spot radiographs demonstrate removal of external fixation and ORIF of the bimalleolar fracture with application of a lateral plate and screw construct along the distal fibula, and placement of 2 cannulated lag screws complete with washers across the medial malleolar fracture. Irregularity of the soft tissues overlying the medial malleolus consistent with an open wound. IMPRESSION: ORIF of bimalleolar fracture as above. No evidence of immediate hardware complication. The previously noted external fixator has been removed. Electronically Signed   By: Malachy MoanHeath  McCullough M.D.   On: 02/20/2018 11:53   Dg C-arm 1-60 Min  Result Date: 02/18/2018 CLINICAL DATA:  External fixation of left lower leg. EXAM: LEFT TIBIA AND FIBULA - 2 VIEW; DG C-ARM 61-120 MIN COMPARISON:  02/18/2018 preoperative ankle radiographs. FINDINGS: Fine bony detail is limited by the C-arm fluoroscopic technique. A total of 16 seconds of fluoroscopic time was utilized for external fixation across an open fracture dislocation involving the left tibiotalar joint and malleoli. Improved anatomic alignment despite limited fine bony detail due to the C-arm fluoroscopic technique. IMPRESSION: External fixation across the patient's known trimalleolar fracture dislocation of the ankle joint. Improved alignment with external fixation hardware in place. Electronically Signed   By: Tollie Ethavid  Kwon M.D.   On: 02/18/2018 21:20     Time Spent in minutes  30   Susa RaringPrashant Zakhi Dupre M.D on 02/20/2018 at 2:03 PM  To page go to [email or web address] - password [email or web address]

## 2018-02-20 NOTE — Progress Notes (Signed)
Plan today for repeat I&D of open left ankle fracture with removal of external fixator and hardware placement for bone fracture.  Risk and benefits discussed.  All questions answered

## 2018-02-20 NOTE — Progress Notes (Signed)
Patient to OR

## 2018-02-20 NOTE — Progress Notes (Signed)
ANTICOAGULATION CONSULT NOTE - Initial Consult  Pharmacy Consult for Xarelto Indication: VTE prophylaxis  Allergies  Allergen Reactions  . Atorvastatin Other (See Comments)    Made arms very sore  . Colchicine Other (See Comments)  . Tape Other (See Comments)    Patient bruises VERY easily and skin is FRAGILE!!    Patient Measurements:    Vital Signs: Temp: 97.3 F (36.3 C) (08/31 1225) Temp Source: Oral (08/31 0542) BP: 151/63 (08/31 1225) Pulse Rate: 69 (08/31 1225)  Labs: Recent Labs    02/18/18 1404 02/19/18 0818 02/20/18 0358  HGB 11.5* 9.7* 9.3*  HCT 36.0 30.8* 29.5*  PLT 143* 129* 137*  CREATININE 1.72* 1.52* 1.42*    CrCl cannot be calculated (Unknown ideal weight.).   Medical History: Past Medical History:  Diagnosis Date  . B12 deficiency   . Borderline diabetes   . COPD (chronic obstructive pulmonary disease) (HCC)    3L home O2  . HTN (hypertension)     Assessment: 84 year old female with open L-ankle fracture s/p removal of external fixator with I&D around area and subsequent open reduction internal fixation of bimalleolar ankle fracture as well as closer of open laceration on ankle. Pharmacy consulted for Xarelto dosing for VTE prophylaxis post orthopedic surgery with plans to start tonight per Dr. August Saucerean. Currently no significant drug interactions are noted on review of inpatient and outpatient medication list.   H/H 9.3/29.5- low stable. Will monitor trend.  Platelets 143>>129>>137. Will watch.  No bleeding reported.  SCr 1.42 (improving) with estimated nCrCl ~34 mL/min.    Goal of Therapy:  Monitor platelets by anticoagulation protocol: Yes   Plan:  Will start Xarelto 10 mg po daily this PM.  Monitor CBC and for any signs or symptoms of bleeding.  Watch renal function.    Link SnufferJessica Bellarose Burtt, PharmD, BCPS, BCCCP Clinical Pharmacist Clinical phone 02/20/2018 until 3:30PM [phone]- #[phone] Please refer to [facility] for [facility]  numbers 02/20/2018,1:02 PM

## 2018-02-20 NOTE — Anesthesia Preprocedure Evaluation (Addendum)
Anesthesia Evaluation  Patient identified by MRN, date of birth, ID band Patient awake    Reviewed: Allergy & Precautions, NPO status , Patient's Chart, lab work & pertinent test results, reviewed documented beta blocker date and time   Airway Mallampati: II  TM Distance: >3 FB Neck ROM: Full    Dental  (+) Edentulous Lower, Edentulous Upper   Pulmonary COPD,  COPD inhaler and oxygen dependent, former smoker,    Pulmonary exam normal breath sounds clear to auscultation       Cardiovascular hypertension, Pt. on medications and Pt. on home beta blockers Normal cardiovascular exam Rhythm:Regular Rate:Normal     Neuro/Psych negative neurological ROS  negative psych ROS   GI/Hepatic negative GI ROS, Neg liver ROS,   Endo/Other  negative endocrine ROS  Renal/GU negative Renal ROS     Musculoskeletal negative musculoskeletal ROS (+)   Abdominal   Peds  Hematology  (+) Blood dyscrasia, anemia ,   Anesthesia Other Findings Day of surgery medications reviewed with the patient.  Reproductive/Obstetrics negative OB ROS                            Anesthesia Physical  Anesthesia Plan  ASA: III  Anesthesia Plan: General   Post-op Pain Management:  Regional for Post-op pain   Induction: Intravenous  PONV Risk Score and Plan: 4 or greater and Ondansetron, Dexamethasone and Treatment may vary due to age or medical condition  Airway Management Planned: Oral ETT  Additional Equipment:   Intra-op Plan:   Post-operative Plan: Extubation in OR  Informed Consent: I have reviewed the patients History and Physical, chart, labs and discussed the procedure including the risks, benefits and alternatives for the proposed anesthesia with the patient or authorized representative who has indicated his/her understanding and acceptance.   Dental advisory given  Plan Discussed with: CRNA, Anesthesiologist and  Surgeon  Anesthesia Plan Comments:       Anesthesia Quick Evaluation

## 2018-02-20 NOTE — Transfer of Care (Signed)
Immediate Anesthesia Transfer of Care Note  Patient: Ashley Mora  Procedure(s) Performed: REMOVAL EXTERNAL FIXATION LEG (Left Ankle) IRRIGATION AND DEBRIDEMENT LEG (Left Ankle) OPEN REDUCTION INTERNAL FIXATION (ORIF) ANKLE FRACTURE (Left Ankle)  Patient Location: PACU  Anesthesia Type:General  Level of Consciousness: awake, alert , oriented and patient cooperative  Airway & Oxygen Therapy: Patient Spontanous Breathing and Patient connected to nasal cannula oxygen  Post-op Assessment: Report given to RN, Post -op Vital signs reviewed and stable and Patient moving all extremities X 4  Post vital signs: Reviewed and stable  Last Vitals:  Vitals Value Taken Time  BP 151/97 02/20/2018 10:52 AM  Temp    Pulse 93 02/20/2018 10:56 AM  Resp 26 02/20/2018 10:56 AM  SpO2 98 % 02/20/2018 10:56 AM  Vitals shown include unvalidated device data.  Last Pain:  Vitals:   02/20/18 0542  TempSrc: Oral  PainSc:       Patients Stated Pain Goal: 2 (02/19/18 1955)  Complications: No apparent anesthesia complications

## 2018-02-20 NOTE — Op Note (Signed)
NAMERICQUEL, FOULK MEDICAL RECORD ZO:[id number] ACCOUNT [id number] DATE OF BIRTH:[birth year] FACILITY: [facility] LOCATION: [facility] PHYSICIAN:Dalayah Deahl Diamantina Providence, MD  OPERATIVE REPORT  DATE OF PROCEDURE:  02/20/2018  PREOPERATIVE DIAGNOSIS:  Open left ankle fracture.   POSTOPERATIVE DIAGNOSIS:  Open left ankle fracture.  PROCEDURE:  Removal of external fixator with irrigation and excisional debridement around the open medial fracture with subsequent open reduction internal fixation of bimalleolar ankle fracture and closure of 8 cm open laceration on the medial aspect of  the ankle.  SURGEON:  Cammy Copa, MD  ASSISTANT:  None.  ANESTHESIA:  General.  INDICATIONS:  Ashley Mora is an 84 year old patient who is now 48 hours out from open left ankle fracture dislocation.  She presents now for operative management after explanation of risks and benefits.  PROCEDURE IN DETAIL:  The patient was brought to the operating room where general anesthesia was induced.  Preoperative and perioperative IV antibiotics were continued.  Timeout was called.  Left leg was then prepped with chlorhexidine and draped in a  sterile manner.  A glove was placed over the foot.  The external fixator was removed prior to prepping and draping including the pins.  Then, a chlorhexidine prep was performed.  Sutures were then removed from the open incision site.  The excisional  debridement of devitalized appearing skin, subcutaneous tissue, muscle, fascia and bone was then performed.  In general, the tissue around the medial aspect of the ankle joint appeared reasonably healthy.  However, it was very thin consistent with  malnutrition and poor nutritional status.  The fracture site was opened again and thorough irrigation with 3 liters of irrigating solution was performed.  Vancomycin powder placed into the joint.  The fracture was then reduced and attention was directed  towards the lateral side.  Incision made over the  lateral malleolus.  Skin and subcutaneous tissue were sharply divided.  Care was taken to avoid injury to superficial peroneal sensory nerve.  Biomet 3-hole cloverleaf lateral plate was applied with good  reduction of the fracture achieved.  Nonlocking screws x3 placed proximally and locking screws x5 placed distally.  This gave a very nice reduction of the fracture.  at this time.  That incision was thoroughly irrigated and then the incision was closed  using 2-0 Vicryl and 3-0 nylon.  Again, the skin quality was poor.  An incisional VAC x2 will be used due to the high risk incision.    Attention was then redirected towards the medial aspect of the ankle.  Medial malleolar fracture was reduced.  Bone quality also poor in addition to overall soft tissue quality.  Nonetheless, 2 screws were placed with good reduction of the fracture and  good restoration of the mortise symmetry.  Syndesmosis was stable.  Biomet 4-0 cannulated screws x2, 40 mm were utilized.  At this time, thorough irrigation again performed.  The skin was then closed using 3-0 nylon suture.  Because subdermal tissue was  very thin and actually could not hold any 2-0 or 3-0 Vicryl suture.  At this time 2 incisional wound VACs were placed over both incisions.  Fluoroscopy was utilized to confirm correct placement of hardware in the AP and lateral planes.  The wound VAC was  placed and a posterior splint was applied, well padded.    The patient was then transferred to the recovery room in stable condition.  It should be noted also that [name] came in and changed the dressing on the right hand during the  time of the surgery, but did not use any of the back table instruments.  AN/NUANCE  D:02/20/2018 T:02/20/2018 JOB:[id number]

## 2018-02-20 NOTE — Anesthesia Postprocedure Evaluation (Signed)
Anesthesia Post Note  Patient: Ashley Mora  Procedure(s) Performed: REMOVAL EXTERNAL FIXATION LEG (Left Ankle) IRRIGATION AND DEBRIDEMENT LEG (Left Ankle) OPEN REDUCTION INTERNAL FIXATION (ORIF) ANKLE FRACTURE (Left Ankle)     Patient location during evaluation: PACU Anesthesia Type: General Level of consciousness: awake and alert, awake and oriented Pain management: pain level controlled Vital Signs Assessment: post-procedure vital signs reviewed and stable Respiratory status: spontaneous breathing, nonlabored ventilation and respiratory function stable Cardiovascular status: blood pressure returned to baseline and stable Postop Assessment: no apparent nausea or vomiting Anesthetic complications: no    Last Vitals:  Vitals:   02/20/18 1434 02/20/18 2023  BP: (!) 196/70 (!) 180/70  Pulse: 94 88  Resp: 17 16  Temp: (!) 36.3 C 36.9 C  SpO2: 99% 97%    Last Pain:  Vitals:   02/20/18 2023  TempSrc: Oral  PainSc:                  Cecile HearingStephen Edward Fallon Howerter

## 2018-02-21 LAB — BASIC METABOLIC PANEL
Anion gap: 7 (ref 5–15)
BUN: 27 mg/dL — ABNORMAL HIGH (ref 8–23)
CALCIUM: 8.5 mg/dL — AB (ref 8.9–10.3)
CO2: 26 mmol/L (ref 22–32)
CREATININE: 1.24 mg/dL — AB (ref 0.44–1.00)
Chloride: 111 mmol/L (ref 98–111)
GFR, EST AFRICAN AMERICAN: 45 mL/min — AB (ref >60)
GFR, EST NON AFRICAN AMERICAN: 39 mL/min — AB (ref >60)
GLUCOSE: 123 mg/dL — AB (ref 70–99)
Potassium: 4.5 mmol/L (ref 3.5–5.1)
Sodium: 144 mmol/L (ref 135–145)

## 2018-02-21 LAB — GLUCOSE, CAPILLARY
GLUCOSE-CAPILLARY: 99 mg/dL (ref 70–99)
GLUCOSE-CAPILLARY: 104 mg/dL — AB (ref 70–99)
GLUCOSE-CAPILLARY: 104 mg/dL — AB (ref 70–99)
Glucose-Capillary: 131 mg/dL — ABNORMAL HIGH (ref 70–99)

## 2018-02-21 LAB — CBC
HEMATOCRIT: 30.6 % — AB (ref 36.0–46.0)
Hemoglobin: 9.2 g/dL — ABNORMAL LOW (ref 12.0–15.0)
MCH: 29 pg (ref 26.0–34.0)
MCHC: 30.1 g/dL (ref 30.0–36.0)
MCV: 96.5 fL (ref 78.0–100.0)
PLATELETS: 140 10*3/uL — AB (ref 150–400)
RBC: 3.17 MIL/uL — ABNORMAL LOW (ref 3.87–5.11)
RDW: 14.3 % (ref 11.5–15.5)
WBC: 8.7 10*3/uL (ref 4.0–10.5)

## 2018-02-21 NOTE — Progress Notes (Signed)
@IPLOG @        PROGRESS NOTE                                                                                                                                                                                                             Patient Demographics:    Ashley Mora, is a 84 y.o. female, DOB - [birth year], ZOX:[id number]  Admit date - 02/18/2018   Admitting Physician Jonah Blue, MD  Outpatient Primary MD for the patient is [name], MD  LOS - 3  Chief Complaint  Patient presents with  . Fall  . tib fib fx       Brief Narrative  Ashley Mora is a 84 y.o. female with medical history significant of  COPD on 3L home O2; preDM; HTN presenting with open fracture/dislocation.  She was going into her house and she tripped over the threshold and fell.  She landed on the steps and had an open fracture.  She had a similar fracture on the right about 5 years ago.  She also had a cat bite.  She jerked her hand when she bit.  It looked like it was healing and about 3-4 days ago it started swelling.  She did see the doctor about this today.  She has an appointment with the orthopedist tomorrow regarding chronic progressive knee pain.  She was diagnosed with open left ankle fracture at home with right hand and arm cellulitis and admitted to the hospital.   Subjective:   Patient in bed, appears comfortable, denies any headache, no fever, no chest pain or pressure, no shortness of breath , no abdominal pain. No focal weakness.   Assessment  & Plan :     Open ankle fracture Left -  Mechanical fall resulting in ankle fracture, seen by orthopedics she initially had external fixators placed on 02/18/2018 now underwent surgical correction on 02/20/2018, tolerated the procedure well, no weightbearing on the left leg per orthopedics, monitor H&H, continue to monitor clinically, will require SNF.  Continue supportive care.  Cellulitis of the L.arm/hand - after a recent cat bite, currently on  Unasyn and improving, switch to Augmentin tomorrow.  Renal failure -Uncertain baseline creatinine and no documentation available at this time, monitor with holding of ACE inhibitor and gentle hydration.   Chronic respiratory failure due to COPD, on home O2 - Continue home O2, nebulizer treatments as needed.  Stable no acute issues.    HTN - ACE inhibitor on hold, currently on Norvasc, Catapres and Toprol-XL with good control.  Anemia.  Combination of perioperative blood loss along with dilution, type screen and monitor.    Borderline DM - A1c is satisfactory, and diet-controlled, sliding scale here.  Lab Results  Component Value Date   HGBA1C 5.8 (H) 02/18/2018   CBG (last 3)  Recent Labs    02/20/18 1629 02/20/18 2206 02/21/18 0738  GLUCAP 218* 127* 99     Diet :  Diet Order            Diet Carb Modified Fluid consistency: Thin; Room service appropriate? Yes  Diet effective now               Family Communication  :  None  Code Status :  Full  Disposition Plan  :  SNF  Consults  :  Ortho  Procedures  :    L. Ankle surgery - 02/18/18  DVT Prophylaxis  :   Heparin    Lab Results  Component Value Date   PLT 140 (L) 02/21/2018    Inpatient Medications  Scheduled Meds: . amLODipine  10 mg Oral Daily  . chlorhexidine  60 mL Topical Once  . cloNIDine  0.2 mg Oral BID  . docusate sodium  100 mg Oral BID  . fluticasone furoate-vilanterol  1 puff Inhalation Daily  . gabapentin  300 mg Oral TID  . insulin aspart  0-9 Units Subcutaneous TID WC  . metoprolol succinate  50 mg Oral Daily  . pantoprazole  40 mg Oral Daily  . povidone-iodine  2 application Topical Once  . rivaroxaban  10 mg Oral Daily  . umeclidinium bromide  1 puff Inhalation Daily   Continuous Infusions: . ampicillin-sulbactam (UNASYN) IV 1.5 g (02/21/18 1005)  . lactated ringers 10 mL/hr at 02/20/18 1858   PRN Meds:.acetaminophen, albuterol, ALPRAZolam, fentaNYL (SUBLIMAZE) injection,  HYDROcodone-acetaminophen, morphine injection, [DISCONTINUED] ondansetron **OR** ondansetron (ZOFRAN) IV  Antibiotics  :    Anti-infectives (From admission, onward)   Start     Dose/Rate Route Frequency Ordered Stop   02/20/18 0902  vancomycin (VANCOCIN) powder  Status:  Discontinued       As needed 02/20/18 0902 02/20/18 1052   02/19/18 0300  ampicillin-sulbactam (UNASYN) 1.5 g in sodium chloride 0.9 % 100 mL IVPB     1.5 g 200 mL/hr over 30 Minutes Intravenous Every 8 hours 02/18/18 1926     02/18/18 1835  vancomycin (VANCOCIN) powder  Status:  Discontinued       As needed 02/18/18 1909 02/18/18 1911   02/18/18 1815  Ampicillin-Sulbactam (UNASYN) 3 g in sodium chloride 0.9 % 100 mL IVPB     3 g 200 mL/hr over 30 Minutes Intravenous To Surgery 02/18/18 1805 02/18/18 1911   02/18/18 1445  ceFAZolin (ANCEF) IVPB 1 g/50 mL premix     1 g 100 mL/hr over 30 Minutes Intravenous  Once 02/18/18 1444 02/18/18 1646         Objective:   Vitals:   02/20/18 2023 02/21/18 0015 02/21/18 0534 02/21/18 0910  BP: (!) 180/70 (!) 165/62 (!) 151/55   Pulse: 88 74 65   Resp: 16 16 16    Temp: 98.5 F (36.9 C) 98.1 F (36.7 C) 98 F (36.7 C)   TempSrc: Oral Oral Oral   SpO2: 97% 99% 99% 98%    Wt Readings from Last 3 Encounters:  No data found for Wt     Intake/Output Summary (Last 24 hours) at 02/21/2018 1022 Last data filed at 02/20/2018 1700 Gross per 24 hour  Intake 810 ml  Output 250 ml  Net 560 ml     Physical Exam  Awake Alert, Oriented X 3, No new F.N deficits, Normal affect [place].AT,PERRAL Supple Neck,No JVD, No cervical lymphadenopathy appriciated.  Symmetrical Chest wall movement, Good air movement bilaterally, CTAB RRR,No Gallops, Rubs or new Murmurs, No Parasternal Heave +ve B.Sounds, Abd Soft, No tenderness, No organomegaly appriciated, No rebound - guarding or rigidity. No Cyanosis, R wrist under bandage, L ankle/foot under bandage    Data Review:    CBC Recent  Labs  Lab 02/18/18 1404 02/19/18 0818 02/20/18 0358 02/21/18 0453  WBC 26.1* 17.3* 14.3* 8.7  HGB 11.5* 9.7* 9.3* 9.2*  HCT 36.0 30.8* 29.5* 30.6*  PLT 143* 129* 137* 140*  MCV 94.0 94.2 94.9 96.5  MCH 30.0 29.7 29.9 29.0  MCHC 31.9 31.5 31.5 30.1  RDW 13.9 13.8 14.3 14.3  LYMPHSABS 1.3  --   --   --   MONOABS 2.1*  --   --   --   EOSABS 0.0  --   --   --   BASOSABS 0.0  --   --   --     Chemistries  Recent Labs  Lab 02/18/18 1404 02/19/18 0818 02/20/18 0358 02/21/18 0453  NA 142 142 143 144  K 4.0 4.5 4.7 4.5  CL 107 110 112* 111  CO2 25 25 27 26   GLUCOSE 121* 223* 133* 123*  BUN 40* 35* 33* 27*  CREATININE 1.72* 1.52* 1.42* 1.24*  CALCIUM 8.8* 8.3* 8.5* 8.5*  MG  --  1.9  --   --    ------------------------------------------------------------------------------------------------------------------ No results for input(s): CHOL, HDL, LDLCALC, TRIG, CHOLHDL, LDLDIRECT in the last 72 hours.  Lab Results  Component Value Date   HGBA1C 5.8 (H) 02/18/2018   ------------------------------------------------------------------------------------------------------------------ No results for input(s): TSH, T4TOTAL, T3FREE, THYROIDAB in the last 72 hours.  Invalid input(s): FREET3 ------------------------------------------------------------------------------------------------------------------ No results for input(s): VITAMINB12, FOLATE, FERRITIN, TIBC, IRON, RETICCTPCT in the last 72 hours.  Coagulation profile No results for input(s): INR, PROTIME in the last 168 hours.  No results for input(s): DDIMER in the last 72 hours.  Cardiac Enzymes No results for input(s): CKMB, TROPONINI, MYOGLOBIN in the last 168 hours.  Invalid input(s): CK ------------------------------------------------------------------------------------------------------------------ No results found for: BNP  Micro Results Recent Results (from the past 240 hour(s))  Surgical pcr screen     Status:  None   Collection Time: 02/19/18  5:08 PM  Result Value Ref Range Status   MRSA, PCR NEGATIVE NEGATIVE Final   Staphylococcus aureus NEGATIVE NEGATIVE Final    Comment: (NOTE) The Xpert SA Assay (FDA approved for NASAL specimens in patients 33 years of age and older), is one component of a comprehensive surveillance program. It is not intended to diagnose infection nor to guide or monitor treatment. Performed at [facility], [address] [place], [place] [address]     Radiology Reports Dg Tibia/fibula Left  Result Date: 02/18/2018 CLINICAL DATA:  External fixation of left lower leg. EXAM: LEFT TIBIA AND FIBULA - 2 VIEW; DG C-ARM 61-120 MIN COMPARISON:  02/18/2018 preoperative ankle radiographs. FINDINGS: Fine bony detail is limited by the C-arm fluoroscopic technique. A total of 16 seconds of fluoroscopic time was utilized for external fixation across an open fracture dislocation involving the left tibiotalar joint and malleoli. Improved anatomic alignment despite limited fine bony detail due to the C-arm fluoroscopic technique. IMPRESSION: External fixation across the patient's known trimalleolar fracture dislocation of the  ankle joint. Improved alignment with external fixation hardware in place. Electronically Signed   By: Tollie Eth M.D.   On: 02/18/2018 21:20   Dg Ankle 2 Views Left  Result Date: 02/20/2018 CLINICAL DATA:  84 year old female undergoing ORIF of bimalleolar fracture. EXAM: LEFT ANKLE - 2 VIEW; DG C-ARM 61-120 MIN COMPARISON:  Prior radiographs 02/18/2018 FINDINGS: Three intraoperative spot radiographs demonstrate removal of external fixation and ORIF of the bimalleolar fracture with application of a lateral plate and screw construct along the distal fibula, and placement of 2 cannulated lag screws complete with washers across the medial malleolar fracture. Irregularity of the soft tissues overlying the medial malleolus consistent with an open wound. IMPRESSION:  ORIF of bimalleolar fracture as above. No evidence of immediate hardware complication. The previously noted external fixator has been removed. Electronically Signed   By: Malachy Moan M.D.   On: 02/20/2018 11:53   Dg Chest Portable 1 View  Result Date: 02/18/2018 CLINICAL DATA:  Larey Seat at home, leg injury. EXAM: PORTABLE CHEST 1 VIEW COMPARISON:  Chest radiograph September 04, 2017 FINDINGS: Cardiac silhouette is moderately enlarged. Calcified aortic arch. Mild chronic interstitial changes with LEFT lung base bandlike density. Increased lung volumes. Biapical pleural thickening. No pneumothorax. Osteopenia. Soft tissue planes are non suspicious. IMPRESSION: 1. Increased lung volumes, possible COPD. LEFT lung base atelectasis/scarring. 2. Cardiomegaly. 3.  Aortic Atherosclerosis (ICD10-I70.0). Electronically Signed   By: Awilda Metro M.D.   On: 02/18/2018 14:42   Dg Tibia/fibula Left Port  Result Date: 02/18/2018 CLINICAL DATA:  Post reduction of open left ankle fracture. EXAM: PORTABLE LEFT TIBIA AND FIBULA - 2 VIEW COMPARISON:  Plain film of the LEFT ankle from earlier same day. FINDINGS: Markedly improved alignment of the osseous structures of the LEFT ankle. Ankle mortise appear symmetric. Overlying casting in place. IMPRESSION: Markedly improved alignment of the osseous structures at the LEFT ankle. Electronically Signed   By: Bary Richard M.D.   On: 02/18/2018 15:50   Dg Ankle Left Port  Result Date: 02/20/2018 CLINICAL DATA:  84 year old female status post ORIF of bimalleolar fracture EXAM: PORTABLE LEFT ANKLE - 2 VIEW COMPARISON:  Intraoperative radiographs obtained earlier today FINDINGS: Interval surgical changes of ORIF of a distal fibular fracture with a lateral buttress plate and screw construct. No evidence of hardware complication. The medial malleolar fracture has been transfixed with 2 cannulated lag screws 1 of which passes through a washer. Alignment of the fracture fragments is  significantly improved. A wound VAC is present as well as plaster splinting material. IMPRESSION: ORIF of a bimalleolar fracture without evidence of immediate hardware complication. Electronically Signed   By: Malachy Moan M.D.   On: 02/20/2018 11:54   Dg Ankle Left Port  Result Date: 02/18/2018 CLINICAL DATA:  Post reduction of open left ankle fracture. EXAM: PORTABLE LEFT ANKLE - 2 VIEW COMPARISON:  Plain films from earlier same day. FINDINGS: There has been markedly improved alignment of the osseous structures at the LEFT ankle. Ankle mortise now appears symmetric. Visualized portions of the hindfoot and midfoot appear intact and normally aligned. Overlying casting in place. IMPRESSION: Markedly improved alignment of the osseous structures at the LEFT ankle. Casting in place. Electronically Signed   By: Bary Richard M.D.   On: 02/18/2018 15:51   Dg Ankle Left Port  Result Date: 02/18/2018 CLINICAL DATA:  84 year old female with a history tib-fib fracture after a fall EXAM: PORTABLE LEFT ANKLE - 2 VIEW COMPARISON:  None. FINDINGS: Osteopenia. Lateral talar fracture  dislocation with fracture of the medial malleolus, lateral malleolus, and possible posterior malleolar fracture on the lateral view. Soft tissue swelling and subcutaneous gas. IMPRESSION: Open talar fracture dislocation, with plain film evidence of trimalleolar fracture and lateral talar dislocation. Osteopenia. Electronically Signed   By: Gilmer Mor D.O.   On: 02/18/2018 14:42   Dg C-arm 1-60 Min  Result Date: 02/20/2018 CLINICAL DATA:  84 year old female undergoing ORIF of bimalleolar fracture. EXAM: LEFT ANKLE - 2 VIEW; DG C-ARM 61-120 MIN COMPARISON:  Prior radiographs 02/18/2018 FINDINGS: Three intraoperative spot radiographs demonstrate removal of external fixation and ORIF of the bimalleolar fracture with application of a lateral plate and screw construct along the distal fibula, and placement of 2 cannulated lag screws  complete with washers across the medial malleolar fracture. Irregularity of the soft tissues overlying the medial malleolus consistent with an open wound. IMPRESSION: ORIF of bimalleolar fracture as above. No evidence of immediate hardware complication. The previously noted external fixator has been removed. Electronically Signed   By: Malachy Moan M.D.   On: 02/20/2018 11:53   Dg C-arm 1-60 Min  Result Date: 02/20/2018 CLINICAL DATA:  84 year old female undergoing ORIF of bimalleolar fracture. EXAM: LEFT ANKLE - 2 VIEW; DG C-ARM 61-120 MIN COMPARISON:  Prior radiographs 02/18/2018 FINDINGS: Three intraoperative spot radiographs demonstrate removal of external fixation and ORIF of the bimalleolar fracture with application of a lateral plate and screw construct along the distal fibula, and placement of 2 cannulated lag screws complete with washers across the medial malleolar fracture. Irregularity of the soft tissues overlying the medial malleolus consistent with an open wound. IMPRESSION: ORIF of bimalleolar fracture as above. No evidence of immediate hardware complication. The previously noted external fixator has been removed. Electronically Signed   By: Malachy Moan M.D.   On: 02/20/2018 11:53   Dg C-arm 1-60 Min  Result Date: 02/18/2018 CLINICAL DATA:  External fixation of left lower leg. EXAM: LEFT TIBIA AND FIBULA - 2 VIEW; DG C-ARM 61-120 MIN COMPARISON:  02/18/2018 preoperative ankle radiographs. FINDINGS: Fine bony detail is limited by the C-arm fluoroscopic technique. A total of 16 seconds of fluoroscopic time was utilized for external fixation across an open fracture dislocation involving the left tibiotalar joint and malleoli. Improved anatomic alignment despite limited fine bony detail due to the C-arm fluoroscopic technique. IMPRESSION: External fixation across the patient's known trimalleolar fracture dislocation of the ankle joint. Improved alignment with external fixation hardware  in place. Electronically Signed   By: Tollie Eth M.D.   On: 02/18/2018 21:20    Time Spent in minutes  30   Susa Raring M.D on 02/21/2018 at 10:22 AM  To page go to [email or web address] - password [email or web address]

## 2018-02-21 NOTE — Progress Notes (Signed)
     Subjective: 1 Day Post-Op Procedure(s) (LRB): REMOVAL EXTERNAL FIXATION LEG (Left) IRRIGATION AND DEBRIDEMENT LEG (Left) OPEN REDUCTION INTERNAL FIXATION (ORIF) ANKLE FRACTURE (Left)Awake, alert and oriented x  4. Right hand cat bite. Left open ankle fracture post I&D and VAC with external fixator.  Patient reports pain as moderate.    Objective:   VITALS:  Temp:  [97.3 F (36.3 C)-98.5 F (36.9 C)] 98 F (36.7 C) (09/01 0534) Pulse Rate:  [65-94] 65 (09/01 0534) Resp:  [16-17] 16 (09/01 0534) BP: (151-196)/(55-70) 151/55 (09/01 0534) SpO2:  [97 %-99 %] 98 % (09/01 0910) Weight:  [88.5 kg] 88.5 kg (09/01 1112)  Neurologically intact ABD soft Neurovascular intact Sensation intact distally Intact pulses distally Dorsiflexion/Plantar flexion intact Incision: dressing C/D/I and VAC intact and functioning normally. Right hand with mild swelling dressing intact right fingers warm and good cap refill. Compartment soft   LABS Recent Labs    02/18/18 1404 02/19/18 0818 02/20/18 0358 02/21/18 0453  HGB 11.5* 9.7* 9.3* 9.2*  WBC 26.1* 17.3* 14.3* 8.7  PLT 143* 129* 137* 140*   Recent Labs    02/20/18 0358 02/21/18 0453  NA 143 144  K 4.7 4.5  CL 112* 111  CO2 27 26  BUN 33* 27*  CREATININE 1.42* 1.24*  GLUCOSE 133* 123*   No results for input(s): LABPT, INR in the last 72 hours.   Assessment/Plan: 1 Day Post-Op Procedure(s) (LRB): REMOVAL EXTERNAL FIXATION LEG (Left) IRRIGATION AND DEBRIDEMENT LEG (Left) OPEN REDUCTION INTERNAL FIXATION (ORIF) ANKLE FRACTURE (Left)  Advance diet Up with therapy Continue ABX therapy due to right hand infection, left ankle open fracture.   Vira Browns 02/21/2018, 12:42 PMPatient ID: Charleston Ropes, female   DOB: 08-30-1932, 84 y.o.   MRN: [id number]

## 2018-02-21 NOTE — Plan of Care (Signed)
  Problem: Education: Goal: Knowledge of General Education information will improve Description Including pain rating scale, medication(s)/side effects and non-pharmacologic comfort measures Outcome: Progressing   Problem: Clinical Measurements: Goal: Will remain free from infection Outcome: Progressing Goal: Respiratory complications will improve Outcome: Progressing   Problem: Activity: Goal: Risk for activity intolerance will decrease Outcome: Not Progressing Note:  Waiting on therapy assessment    Problem: Coping: Goal: Level of anxiety will decrease Outcome: Progressing   Problem: Elimination: Goal: Will not experience complications related to bowel motility Outcome: Progressing   Problem: Pain Managment: Goal: General experience of comfort will improve Outcome: Progressing

## 2018-02-21 NOTE — Evaluation (Signed)
Physical Therapy Evaluation Patient Details Name: Ashley Mora MRN: [id number] DOB: 11/14/1932 Today's Date: 02/21/2018   History of Present Illness  Pt is a 84 y.o. F with significant PMH of COPD on 3L O2, HTN, who fell going up steps and sustained an open left ankle fracture dislocation. Now s/p ORIF of left ankle.  Clinical Impression  Pt admitted with above diagnosis. Pt currently with functional limitations due to the deficits listed below (see PT Problem List). Patient is independent with walker at baseline but does have history of falls. Currently requiring up to two person maximal assistance to transfer from bed to chair. Will likely benefit from practicing slideboard transfers for increased independence and safety. Pt will benefit from skilled PT to increase their independence and safety with mobility to allow discharge to the venue listed below.       Follow Up Recommendations SNF    Equipment Recommendations  Other (comment)(defer to next venue)    Recommendations for Other Services OT consult     Precautions / Restrictions Precautions Precautions: Fall Precaution Comments: wound vac Restrictions Weight Bearing Restrictions: Yes LLE Weight Bearing: Non weight bearing      Mobility  Bed Mobility Overal bed mobility: Needs Assistance Bed Mobility: Supine to Sit     Supine to sit: Mod assist     General bed mobility comments: mod assist with use of bed pad to bring hips to edge of bed  Transfers Overall transfer level: Needs assistance Equipment used: Rolling walker (2 wheeled) Transfers: Sit to/from [employer] Sit to Stand: Mod assist;Max assist;+2 physical assistance Stand pivot transfers: Max assist;+2 physical assistance       General transfer comment: cues for right foot placement and hand placement for safety. Difficulty achieving upright positioning with trunk flexed. maxA + 2 for stand pivot transfer towards right side    Ambulation/Gait                Stairs            Wheelchair Mobility    Modified Rankin (Stroke Patients Only)       Balance Overall balance assessment: Needs assistance Sitting-balance support: Bilateral upper extremity supported;Feet unsupported Sitting balance-Leahy Scale: Good     Standing balance support: Bilateral upper extremity supported Standing balance-Leahy Scale: Poor                               Pertinent Vitals/Pain Pain Assessment: Faces Faces Pain Scale: Hurts even more Pain Location: left ankle Pain Descriptors / Indicators: Discomfort;Grimacing;Operative site guarding Pain Intervention(s): Limited activity within patient's tolerance;Monitored during session;Premedicated before session    Home Living Family/patient expects to be discharged to:: Private residence Living Arrangements: Other relatives;Other (Comment)(niece) Available Help at Discharge: Family;Available PRN/intermittently Type of Home: House Home Access: Ramped entrance     Home Layout: One level Home Equipment: Emergency planning/management officer - 2 wheels;Wheelchair - manual Additional Comments: Pt previously living with son and daughter in law but states she will likely return to her house which has a ramped entrance    Prior Function Level of Independence: Independent with assistive device(s)         Comments: uses a walker, drives     Hand Dominance        Extremity/Trunk Assessment   Upper Extremity Assessment Upper Extremity Assessment: Overall WFL for tasks assessed    Lower Extremity Assessment Lower Extremity Assessment: RLE deficits/detail;LLE deficits/detail RLE Deficits /  Details: MMT: hip flexion 3+/5, knee extension 4/5 LLE Deficits / Details: grossly at least anti gravity    Cervical / Trunk Assessment Cervical / Trunk Assessment: Kyphotic  Communication   Communication: No difficulties  Cognition Arousal/Alertness: Awake/alert Behavior  During Therapy: WFL for tasks assessed/performed Overall Cognitive Status: Within Functional Limits for tasks assessed                                 General Comments: Somewhat slow processing      General Comments      Exercises     Assessment/Plan    PT Assessment Patient needs continued PT services  PT Problem List Decreased strength;Decreased range of motion;Decreased activity tolerance;Decreased balance;Decreased mobility;Pain       PT Treatment Interventions DME instruction;Gait training;Functional mobility training;Therapeutic activities;Therapeutic exercise;Balance training;Neuromuscular re-education;Patient/family education;Wheelchair mobility training    PT Goals (Current goals can be found in the Care Plan section)  Acute Rehab PT Goals Patient Stated Goal: go back to her house PT Goal Formulation: With patient Time For Goal Achievement: 03/07/18 Potential to Achieve Goals: Good    Frequency Min 3X/week   Barriers to discharge        Co-evaluation               AM-PAC PT "6 Clicks" Daily Activity  Outcome Measure Difficulty turning over in bed (including adjusting bedclothes, sheets and blankets)?: A Lot Difficulty moving from lying on back to sitting on the side of the bed? : Unable Difficulty sitting down on and standing up from a chair with arms (e.g., wheelchair, bedside commode, etc,.)?: Unable Help needed moving to and from a bed to chair (including a wheelchair)?: A Lot Help needed walking in hospital room?: Total Help needed climbing 3-5 steps with a railing? : Total 6 Click Score: 8    End of Session Equipment Utilized During Treatment: Gait belt;Oxygen Activity Tolerance: Patient tolerated treatment well Patient left: in chair;with call bell/phone within reach Nurse Communication: Mobility status PT Visit Diagnosis: Unsteadiness on feet (R26.81);Other abnormalities of gait and mobility (R26.89);History of falling  (Z91.81);Difficulty in walking, not elsewhere classified (R26.2);Pain Pain - Right/Left: Left Pain - part of body: Ankle and joints of foot    Time: 3893-7342 PT Time Calculation (min) (ACUTE ONLY): 30 min   Charges:   PT Evaluation $PT Eval Moderate Complexity: 1 Mod PT Treatments $Therapeutic Activity: 8-22 mins        Laurina Bustle, PT, DPT Acute Rehabilitation Services  Pager: [phone]   Vanetta Mulders 02/21/2018, 4:45 PM

## 2018-02-22 ENCOUNTER — Encounter (HOSPITAL_COMMUNITY): Payer: Self-pay | Admitting: *Deleted

## 2018-02-22 LAB — CBC
HCT: 26.8 % — ABNORMAL LOW (ref 36.0–46.0)
HEMOGLOBIN: 8.2 g/dL — AB (ref 12.0–15.0)
MCH: 29.6 pg (ref 26.0–34.0)
MCHC: 30.6 g/dL (ref 30.0–36.0)
MCV: 96.8 fL (ref 78.0–100.0)
PLATELETS: 147 10*3/uL — AB (ref 150–400)
RBC: 2.77 MIL/uL — AB (ref 3.87–5.11)
RDW: 14.3 % (ref 11.5–15.5)
WBC: 5.9 10*3/uL (ref 4.0–10.5)

## 2018-02-22 LAB — GLUCOSE, CAPILLARY
GLUCOSE-CAPILLARY: 103 mg/dL — AB (ref 70–99)
GLUCOSE-CAPILLARY: 105 mg/dL — AB (ref 70–99)
Glucose-Capillary: 109 mg/dL — ABNORMAL HIGH (ref 70–99)
Glucose-Capillary: 113 mg/dL — ABNORMAL HIGH (ref 70–99)

## 2018-02-22 MED ORDER — HYDRALAZINE HCL 25 MG PO TABS
25.0000 mg | ORAL_TABLET | Freq: Three times a day (TID) | ORAL | Status: DC
  Administered 2018-02-22 – 2018-02-25 (×9): 25 mg via ORAL
  Filled 2018-02-22 (×9): qty 1

## 2018-02-22 MED ORDER — AMOXICILLIN-POT CLAVULANATE 500-125 MG PO TABS
1.0000 | ORAL_TABLET | Freq: Two times a day (BID) | ORAL | Status: DC
  Administered 2018-02-22 – 2018-02-25 (×7): 500 mg via ORAL
  Filled 2018-02-22 (×8): qty 1

## 2018-02-22 NOTE — Evaluation (Signed)
Occupational Therapy Evaluation Patient Details Name: Ashley Mora MRN: [id number] DOB: [birth year] Today's Date: 02/22/2018    History of Present Illness Pt is a 84 y.o. F with significant PMH of COPD on 3L O2, HTN, who fell going up steps and sustained an open left ankle fracture dislocation. Now s/p ORIF of left ankle.   Clinical Impression   PTA, pt was independent with RW for ADL and functional mobility. She currently is limited by L ankle pain and NWB status. She requires max assist +2 for LB ADL and simulated toilet transfers. Pt would benefit from continued OT services while admitted to improve independence and safety with ADL and functional mobility. At current functional level, recommend short-term SNF placement post-acute D/C to maximize return to PLOF.     Follow Up Recommendations  SNF;Supervision/Assistance - 24 hour    Equipment Recommendations  Other (comment)(defer to next venue of care)    Recommendations for Other Services       Precautions / Restrictions Precautions Precautions: Fall Precaution Comments: wound vac Restrictions Weight Bearing Restrictions: Yes LLE Weight Bearing: Non weight bearing      Mobility Bed Mobility Overal bed mobility: Needs Assistance Bed Mobility: Supine to Sit     Supine to sit: Min assist     General bed mobility comments: Min assist at hips to rotate to EOB  Transfers Overall transfer level: Needs assistance Equipment used: Rolling walker (2 wheeled) Transfers: Sit to/from [employer] Sit to Stand: +2 physical assistance;Mod assist Stand pivot transfers: +2 physical assistance;Mod assist       General transfer comment: Assist to power up to standing.     Balance Overall balance assessment: Needs assistance Sitting-balance support: Bilateral upper extremity supported;Feet unsupported Sitting balance-Leahy Scale: Good     Standing balance support: Bilateral upper extremity supported;During  functional activity Standing balance-Leahy Scale: Poor Standing balance comment: Relies on BUE support.                            ADL either performed or assessed with clinical judgement   ADL Overall ADL's : Needs assistance/impaired Eating/Feeding: Set up;Sitting   Grooming: Set up;Sitting   Upper Body Bathing: Min guard;Sitting   Lower Body Bathing: Sit to/from stand;Maximal assistance;+2 for physical assistance   Upper Body Dressing : Min guard;Sitting   Lower Body Dressing: Sit to/from stand;Maximal assistance;+2 for physical assistance   Toilet Transfer: +2 for physical assistance;Moderate assistance;RW   Toileting- Clothing Manipulation and Hygiene: Maximal assistance;Sit to/from stand;+2 for physical assistance       Functional mobility during ADLs: Maximal assistance;+2 for physical assistance;Rolling walker General ADL Comments: Significant assistance for all LB tasks today.      Vision Patient Visual Report: No change from baseline Vision Assessment?: No apparent visual deficits     Perception     Praxis      Pertinent Vitals/Pain Pain Assessment: Faces Faces Pain Scale: Hurts even more Pain Location: left ankle Pain Descriptors / Indicators: Discomfort;Grimacing;Operative site guarding Pain Intervention(s): Limited activity within patient's tolerance;Monitored during session;Repositioned     Hand Dominance     Extremity/Trunk Assessment Upper Extremity Assessment Upper Extremity Assessment: Overall WFL for tasks assessed   Lower Extremity Assessment Lower Extremity Assessment: Defer to PT evaluation   Cervical / Trunk Assessment Cervical / Trunk Assessment: Kyphotic   Communication Communication Communication: No difficulties   Cognition Arousal/Alertness: Awake/alert Behavior During Therapy: WFL for tasks assessed/performed Overall Cognitive Status: Within Functional  Limits for tasks assessed                                      General Comments       Exercises     Shoulder Instructions      Home Living Family/patient expects to be discharged to:: Private residence Living Arrangements: Other relatives;Other (Comment)(niece) Available Help at Discharge: Family;Available PRN/intermittently Type of Home: House Home Access: Ramped entrance     Home Layout: One level     Bathroom Shower/Tub: Estate manager/land agent Accessibility: Yes   Home Equipment: Emergency planning/management officer - 2 wheels;Wheelchair - manual   Additional Comments: Pt previously living with son and daughter in law but states she will likely return to her house which has a ramped entrance      Prior Functioning/Environment Level of Independence: Independent with assistive device(s)        Comments: uses a walker, drives        OT Problem List: Decreased strength;Decreased range of motion;Decreased activity tolerance;Impaired balance (sitting and/or standing);Decreased safety awareness;Decreased knowledge of use of DME or AE;Decreased knowledge of precautions;Pain      OT Treatment/Interventions: Self-care/ADL training;Therapeutic exercise;Energy conservation;DME and/or AE instruction;Therapeutic activities;Patient/family education;Balance training    OT Goals(Current goals can be found in the care plan section) Acute Rehab OT Goals Patient Stated Goal: go back to her house OT Goal Formulation: With patient Time For Goal Achievement: 03/08/18 Potential to Achieve Goals: Good ADL Goals Pt Will Perform Grooming: with min assist;standing Pt Will Perform Lower Body Dressing: sit to/from stand;with min assist Pt Will Transfer to Toilet: with min assist;ambulating;bedside commode(BSC over toilet) Pt Will Perform Toileting - Clothing Manipulation and hygiene: with modified independence;sitting/lateral leans  OT Frequency: Min 2X/week   Barriers to D/C:            Co-evaluation              AM-PAC PT "6  Clicks" Daily Activity     Outcome Measure Help from another person eating meals?: None Help from another person taking care of personal grooming?: None Help from another person toileting, which includes using toliet, bedpan, or urinal?: A Lot Help from another person bathing (including washing, rinsing, drying)?: A Lot Help from another person to put on and taking off regular upper body clothing?: A Little Help from another person to put on and taking off regular lower body clothing?: A Lot 6 Click Score: 17   End of Session Equipment Utilized During Treatment: Rolling walker Nurse Communication: Mobility status  Activity Tolerance: Patient tolerated treatment well Patient left: in chair;with call bell/phone within reach  OT Visit Diagnosis: Other abnormalities of gait and mobility (R26.89);Pain Pain - Right/Left: Left Pain - part of body: Ankle and joints of foot                Time: 1140-1200 OT Time Calculation (min): 20 min Charges:  OT General Charges $OT Visit: 1 Visit OT Evaluation $OT Eval Moderate Complexity: 1 Mod  Doristine Section, MS OTR/L  Pager: [phone]   Easten Maceachern A Seiji Wiswell 02/22/2018, 2:07 PM

## 2018-02-22 NOTE — Progress Notes (Signed)
@IPLOG @        PROGRESS NOTE                                                                                                                                                                                                             Patient Demographics:    Ashley Mora, is a 84 y.o. female, DOB - 24-Feb-1933, ZOX:[id number]  Admit date - 02/18/2018   Admitting Physician Jonah Blue, MD  Outpatient Primary MD for the patient is [name], MD  LOS - 4  Chief Complaint  Patient presents with  . Fall  . tib fib fx       Brief Narrative  Ashley Mora is a 84 y.o. female with medical history significant of  COPD on 3L home O2; preDM; HTN presenting with open fracture/dislocation.  She was going into her house and she tripped over the threshold and fell.  She landed on the steps and had an open fracture.  She had a similar fracture on the right about 5 years ago.  She also had a cat bite.  She jerked her hand when she bit.  It looked like it was healing and about 3-4 days ago it started swelling.  She did see the doctor about this today.  She has an appointment with the orthopedist tomorrow regarding chronic progressive knee pain.  She was diagnosed with open left ankle fracture at home with right hand and arm cellulitis and admitted to the hospital.   Subjective:   Patient in bed, appears comfortable, denies any headache, no fever, no chest pain or pressure, no shortness of breath , no abdominal pain. No focal weakness.   Assessment  & Plan :     Open ankle fracture Left -  Mechanical fall resulting in ankle fracture, seen by orthopedics she initially had external fixators placed on 02/18/2018 now underwent surgical correction on 02/20/2018, tolerated the procedure well, no weightbearing on the left leg per orthopedics, monitor H&H, continue to monitor clinically, will require SNF.  Continue supportive care.  Cellulitis of the L.arm/hand - after a recent cat bite, currently on  Unasyn and improving, switch to Augmentin tomorrow.  Renal failure -Uncertain baseline creatinine and no documentation available at this time, monitor with holding of ACE inhibitor and gentle hydration.   Chronic respiratory failure due to COPD, on home O2 - Continue home O2, nebulizer treatments as needed.  Stable no acute issues.    HTN - ACE inhibitor on hold, currently on Norvasc, Catapres and Toprol-XL, added low-dose hydralazine  for good control.   Anemia.  Combination of perioperative blood loss along with dilution, type screen and monitor.    Borderline DM - A1c is satisfactory on diet-controlled, sliding scale here.  Lab Results  Component Value Date   HGBA1C 5.8 (H) 02/18/2018   CBG (last 3)  Recent Labs    02/21/18 1622 02/21/18 2140 02/22/18 0735  GLUCAP 104* 131* 109*     Diet :  Diet Order            Diet Carb Modified Fluid consistency: Thin; Room service appropriate? Yes  Diet effective now               Family Communication  :  None  Code Status :  Full  Disposition Plan  :  SNF  Consults  :  Ortho  Procedures  :    L. Ankle surgery - 02/18/18  DVT Prophylaxis  :   Xarelto  Lab Results  Component Value Date   PLT 147 (L) 02/22/2018    Inpatient Medications  Scheduled Meds: . amLODipine  10 mg Oral Daily  . amoxicillin-clavulanate  1 tablet Oral BID  . chlorhexidine  60 mL Topical Once  . cloNIDine  0.2 mg Oral BID  . docusate sodium  100 mg Oral BID  . fluticasone furoate-vilanterol  1 puff Inhalation Daily  . gabapentin  300 mg Oral TID  . insulin aspart  0-9 Units Subcutaneous TID WC  . metoprolol succinate  50 mg Oral Daily  . pantoprazole  40 mg Oral Daily  . povidone-iodine  2 application Topical Once  . rivaroxaban  10 mg Oral Daily  . umeclidinium bromide  1 puff Inhalation Daily   Continuous Infusions:  PRN Meds:.acetaminophen, albuterol, ALPRAZolam, fentaNYL (SUBLIMAZE) injection, HYDROcodone-acetaminophen, morphine  injection, [DISCONTINUED] ondansetron **OR** ondansetron (ZOFRAN) IV  Antibiotics  :    Anti-infectives (From admission, onward)   Start     Dose/Rate Route Frequency Ordered Stop   02/22/18 1000  amoxicillin-clavulanate (AUGMENTIN) 500-125 MG per tablet 500 mg     1 tablet Oral 2 times daily 02/22/18 0956     02/20/18 0902  vancomycin (VANCOCIN) powder  Status:  Discontinued       As needed 02/20/18 0902 02/20/18 1052   02/19/18 0300  ampicillin-sulbactam (UNASYN) 1.5 g in sodium chloride 0.9 % 100 mL IVPB  Status:  Discontinued     1.5 g 200 mL/hr over 30 Minutes Intravenous Every 8 hours 02/18/18 1926 02/22/18 0956   02/18/18 1835  vancomycin (VANCOCIN) powder  Status:  Discontinued       As needed 02/18/18 1909 02/18/18 1911   02/18/18 1815  Ampicillin-Sulbactam (UNASYN) 3 g in sodium chloride 0.9 % 100 mL IVPB     3 g 200 mL/hr over 30 Minutes Intravenous To Surgery 02/18/18 1805 02/18/18 1911   02/18/18 1445  ceFAZolin (ANCEF) IVPB 1 g/50 mL premix     1 g 100 mL/hr over 30 Minutes Intravenous  Once 02/18/18 1444 02/18/18 1646         Objective:   Vitals:   02/22/18 0200 02/22/18 0447 02/22/18 0600 02/22/18 0818  BP:  (!) 161/51    Pulse:  (!) 56    Resp:  16    Temp:  97.8 F (36.6 C)    TempSrc:  Oral    SpO2: 99% 100% 99% 100%  Weight:      Height:        Wt Readings from Last 3 Encounters:  02/21/18 88.5 kg     Intake/Output Summary (Last 24 hours) at 02/22/2018 0956 Last data filed at 02/22/2018 0500 Gross per 24 hour  Intake 1380.86 ml  Output 500 ml  Net 880.86 ml     Physical Exam  Awake Alert, Oriented X 3, No new F.N deficits, Normal affect [place].AT,PERRAL Supple Neck,No JVD, No cervical lymphadenopathy appriciated.  Symmetrical Chest wall movement, Good air movement bilaterally, CTAB RRR,No Gallops, Rubs or new Murmurs, No Parasternal Heave +ve B.Sounds, Abd Soft, No tenderness, No organomegaly appriciated, No rebound - guarding or rigidity. No  Cyanosis,  R wrist under bandage, L ankle/foot under bandage    Data Review:    CBC Recent Labs  Lab 02/18/18 1404 02/19/18 0818 02/20/18 0358 02/21/18 0453 02/22/18 0354  WBC 26.1* 17.3* 14.3* 8.7 5.9  HGB 11.5* 9.7* 9.3* 9.2* 8.2*  HCT 36.0 30.8* 29.5* 30.6* 26.8*  PLT 143* 129* 137* 140* 147*  MCV 94.0 94.2 94.9 96.5 96.8  MCH 30.0 29.7 29.9 29.0 29.6  MCHC 31.9 31.5 31.5 30.1 30.6  RDW 13.9 13.8 14.3 14.3 14.3  LYMPHSABS 1.3  --   --   --   --   MONOABS 2.1*  --   --   --   --   EOSABS 0.0  --   --   --   --   BASOSABS 0.0  --   --   --   --     Chemistries  Recent Labs  Lab 02/18/18 1404 02/19/18 0818 02/20/18 0358 02/21/18 0453  NA 142 142 143 144  K 4.0 4.5 4.7 4.5  CL 107 110 112* 111  CO2 25 25 27 26   GLUCOSE 121* 223* 133* 123*  BUN 40* 35* 33* 27*  CREATININE 1.72* 1.52* 1.42* 1.24*  CALCIUM 8.8* 8.3* 8.5* 8.5*  MG  --  1.9  --   --    ------------------------------------------------------------------------------------------------------------------ No results for input(s): CHOL, HDL, LDLCALC, TRIG, CHOLHDL, LDLDIRECT in the last 72 hours.  Lab Results  Component Value Date   HGBA1C 5.8 (H) 02/18/2018   ------------------------------------------------------------------------------------------------------------------ No results for input(s): TSH, T4TOTAL, T3FREE, THYROIDAB in the last 72 hours.  Invalid input(s): FREET3 ------------------------------------------------------------------------------------------------------------------ No results for input(s): VITAMINB12, FOLATE, FERRITIN, TIBC, IRON, RETICCTPCT in the last 72 hours.  Coagulation profile No results for input(s): INR, PROTIME in the last 168 hours.  No results for input(s): DDIMER in the last 72 hours.  Cardiac Enzymes No results for input(s): CKMB, TROPONINI, MYOGLOBIN in the last 168 hours.  Invalid input(s):  CK ------------------------------------------------------------------------------------------------------------------ No results found for: BNP  Micro Results Recent Results (from the past 240 hour(s))  Surgical pcr screen     Status: None   Collection Time: 02/19/18  5:08 PM  Result Value Ref Range Status   MRSA, PCR NEGATIVE NEGATIVE Final   Staphylococcus aureus NEGATIVE NEGATIVE Final    Comment: (NOTE) The Xpert SA Assay (FDA approved for NASAL specimens in patients 64 years of age and older), is one component of a comprehensive surveillance program. It is not intended to diagnose infection nor to guide or monitor treatment. Performed at [facility], [address], [place], [place] [address]     Radiology Reports Dg Tibia/fibula Left  Result Date: 02/18/2018 CLINICAL DATA:  External fixation of left lower leg. EXAM: LEFT TIBIA AND FIBULA - 2 VIEW; DG C-ARM 61-120 MIN COMPARISON:  02/18/2018 preoperative ankle radiographs. FINDINGS: Fine bony detail is limited by the C-arm fluoroscopic technique. A total  of 16 seconds of fluoroscopic time was utilized for external fixation across an open fracture dislocation involving the left tibiotalar joint and malleoli. Improved anatomic alignment despite limited fine bony detail due to the C-arm fluoroscopic technique. IMPRESSION: External fixation across the patient's known trimalleolar fracture dislocation of the ankle joint. Improved alignment with external fixation hardware in place. Electronically Signed   By: Tollie Eth M.D.   On: 02/18/2018 21:20   Dg Ankle 2 Views Left  Result Date: 02/20/2018 CLINICAL DATA:  84 year old female undergoing ORIF of bimalleolar fracture. EXAM: LEFT ANKLE - 2 VIEW; DG C-ARM 61-120 MIN COMPARISON:  Prior radiographs 02/18/2018 FINDINGS: Three intraoperative spot radiographs demonstrate removal of external fixation and ORIF of the bimalleolar fracture with application of a lateral plate and screw  construct along the distal fibula, and placement of 2 cannulated lag screws complete with washers across the medial malleolar fracture. Irregularity of the soft tissues overlying the medial malleolus consistent with an open wound. IMPRESSION: ORIF of bimalleolar fracture as above. No evidence of immediate hardware complication. The previously noted external fixator has been removed. Electronically Signed   By: Malachy Moan M.D.   On: 02/20/2018 11:53   Dg Chest Portable 1 View  Result Date: 02/18/2018 CLINICAL DATA:  Larey Seat at home, leg injury. EXAM: PORTABLE CHEST 1 VIEW COMPARISON:  Chest radiograph September 04, 2017 FINDINGS: Cardiac silhouette is moderately enlarged. Calcified aortic arch. Mild chronic interstitial changes with LEFT lung base bandlike density. Increased lung volumes. Biapical pleural thickening. No pneumothorax. Osteopenia. Soft tissue planes are non suspicious. IMPRESSION: 1. Increased lung volumes, possible COPD. LEFT lung base atelectasis/scarring. 2. Cardiomegaly. 3.  Aortic Atherosclerosis (ICD10-I70.0). Electronically Signed   By: Awilda Metro M.D.   On: 02/18/2018 14:42   Dg Tibia/fibula Left Port  Result Date: 02/18/2018 CLINICAL DATA:  Post reduction of open left ankle fracture. EXAM: PORTABLE LEFT TIBIA AND FIBULA - 2 VIEW COMPARISON:  Plain film of the LEFT ankle from earlier same day. FINDINGS: Markedly improved alignment of the osseous structures of the LEFT ankle. Ankle mortise appear symmetric. Overlying casting in place. IMPRESSION: Markedly improved alignment of the osseous structures at the LEFT ankle. Electronically Signed   By: Bary Richard M.D.   On: 02/18/2018 15:50   Dg Ankle Left Port  Result Date: 02/20/2018 CLINICAL DATA:  84 year old female status post ORIF of bimalleolar fracture EXAM: PORTABLE LEFT ANKLE - 2 VIEW COMPARISON:  Intraoperative radiographs obtained earlier today FINDINGS: Interval surgical changes of ORIF of a distal fibular fracture  with a lateral buttress plate and screw construct. No evidence of hardware complication. The medial malleolar fracture has been transfixed with 2 cannulated lag screws 1 of which passes through a washer. Alignment of the fracture fragments is significantly improved. A wound VAC is present as well as plaster splinting material. IMPRESSION: ORIF of a bimalleolar fracture without evidence of immediate hardware complication. Electronically Signed   By: Malachy Moan M.D.   On: 02/20/2018 11:54   Dg Ankle Left Port  Result Date: 02/18/2018 CLINICAL DATA:  Post reduction of open left ankle fracture. EXAM: PORTABLE LEFT ANKLE - 2 VIEW COMPARISON:  Plain films from earlier same day. FINDINGS: There has been markedly improved alignment of the osseous structures at the LEFT ankle. Ankle mortise now appears symmetric. Visualized portions of the hindfoot and midfoot appear intact and normally aligned. Overlying casting in place. IMPRESSION: Markedly improved alignment of the osseous structures at the LEFT ankle. Casting in place. Electronically Signed  By: Bary Richard M.D.   On: 02/18/2018 15:51   Dg Ankle Left Port  Result Date: 02/18/2018 CLINICAL DATA:  84 year old female with a history tib-fib fracture after a fall EXAM: PORTABLE LEFT ANKLE - 2 VIEW COMPARISON:  None. FINDINGS: Osteopenia. Lateral talar fracture dislocation with fracture of the medial malleolus, lateral malleolus, and possible posterior malleolar fracture on the lateral view. Soft tissue swelling and subcutaneous gas. IMPRESSION: Open talar fracture dislocation, with plain film evidence of trimalleolar fracture and lateral talar dislocation. Osteopenia. Electronically Signed   By: Gilmer Mor D.O.   On: 02/18/2018 14:42   Dg C-arm 1-60 Min  Result Date: 02/20/2018 CLINICAL DATA:  84 year old female undergoing ORIF of bimalleolar fracture. EXAM: LEFT ANKLE - 2 VIEW; DG C-ARM 61-120 MIN COMPARISON:  Prior radiographs 02/18/2018 FINDINGS:  Three intraoperative spot radiographs demonstrate removal of external fixation and ORIF of the bimalleolar fracture with application of a lateral plate and screw construct along the distal fibula, and placement of 2 cannulated lag screws complete with washers across the medial malleolar fracture. Irregularity of the soft tissues overlying the medial malleolus consistent with an open wound. IMPRESSION: ORIF of bimalleolar fracture as above. No evidence of immediate hardware complication. The previously noted external fixator has been removed. Electronically Signed   By: Malachy Moan M.D.   On: 02/20/2018 11:53   Dg C-arm 1-60 Min  Result Date: 02/20/2018 CLINICAL DATA:  84 year old female undergoing ORIF of bimalleolar fracture. EXAM: LEFT ANKLE - 2 VIEW; DG C-ARM 61-120 MIN COMPARISON:  Prior radiographs 02/18/2018 FINDINGS: Three intraoperative spot radiographs demonstrate removal of external fixation and ORIF of the bimalleolar fracture with application of a lateral plate and screw construct along the distal fibula, and placement of 2 cannulated lag screws complete with washers across the medial malleolar fracture. Irregularity of the soft tissues overlying the medial malleolus consistent with an open wound. IMPRESSION: ORIF of bimalleolar fracture as above. No evidence of immediate hardware complication. The previously noted external fixator has been removed. Electronically Signed   By: Malachy Moan M.D.   On: 02/20/2018 11:53   Dg C-arm 1-60 Min  Result Date: 02/18/2018 CLINICAL DATA:  External fixation of left lower leg. EXAM: LEFT TIBIA AND FIBULA - 2 VIEW; DG C-ARM 61-120 MIN COMPARISON:  02/18/2018 preoperative ankle radiographs. FINDINGS: Fine bony detail is limited by the C-arm fluoroscopic technique. A total of 16 seconds of fluoroscopic time was utilized for external fixation across an open fracture dislocation involving the left tibiotalar joint and malleoli. Improved anatomic  alignment despite limited fine bony detail due to the C-arm fluoroscopic technique. IMPRESSION: External fixation across the patient's known trimalleolar fracture dislocation of the ankle joint. Improved alignment with external fixation hardware in place. Electronically Signed   By: Tollie Eth M.D.   On: 02/18/2018 21:20    Time Spent in minutes  30   Susa Raring M.D on 02/22/2018 at 9:56 AM  To page go to [email or web address] - password [facility]

## 2018-02-22 NOTE — Progress Notes (Signed)
Subjective: Pt stable - has been oob to chair   Objective: Vital signs in last 24 hours: Temp:  [97.8 F (36.6 C)-98.5 F (36.9 C)] 97.8 F (36.6 C) (09/02 0447) Pulse Rate:  [56-69] 56 (09/02 0447) Resp:  [16] 16 (09/02 0447) BP: (139-161)/(51-62) 161/51 (09/02 0447) SpO2:  [97 %-100 %] 100 % (09/02 0818) Weight:  [88.5 kg] 88.5 kg (09/01 1112)  Intake/Output from previous day: 09/01 0701 - 09/02 0700 In: 1620.9 [P.O.:920; I.V.:700.9] Out: 500 [Urine:500] Intake/Output this shift: No intake/output data recorded.  Exam:  Dorsiflexion/Plantar flexion intact Compartment soft  Labs: Recent Labs    02/20/18 0358 02/21/18 0453 02/22/18 0354  HGB 9.3* 9.2* 8.2*   Recent Labs    02/21/18 0453 02/22/18 0354  WBC 8.7 5.9  RBC 3.17* 2.77*  HCT 30.6* 26.8*  PLT 140* 147*   Recent Labs    02/20/18 0358 02/21/18 0453  NA 143 144  K 4.7 4.5  CL 112* 111  CO2 27 26  BUN 33* 27*  CREATININE 1.42* 1.24*  GLUCOSE 133* 123*  CALCIUM 8.5* 8.5*   No results for input(s): LABPT, INR in the last 72 hours.  Assessment/Plan: Plan for iv abx for 48 hours post op - wound vac in place can be changed to provena suction on dc to snf either Tuesday or wed    [employer] 02/22/2018, 9:53 AM

## 2018-02-22 NOTE — Clinical Social Work Note (Signed)
Clinical Social Work Assessment  Patient Details  Name: Ashley Mora MRN: [id number] Date of Birth: 1933/04/28  Date of referral:  02/22/18               Reason for consult:  Facility Placement                Permission sought to share information with:  Family Supports Permission granted to share information::  Yes, Verbal Permission Granted  Name::        Agency::     Relationship::  Daughter in Diplomatic Services operational officer Information:     Housing/Transportation Living arrangements for the past 2 months:  Single Family Home Source of Information:  Patient Patient Interpreter Needed:  None Criminal Activity/Legal Involvement Pertinent to Current Situation/Hospitalization:  No - Comment as needed Significant Relationships:  Adult Children Lives with:  Adult Children Do you feel safe going back to the place where you live?  No Need for family participation in patient care:  No (Coment)  Care giving concerns:  Pt is alert and oriented.   Social Worker assessment / plan:  CSW spoke with pt at bedside. Pt states her daughter in law has found a SNF in [facility] however, pt can not recall daughter in laws number. CSW asked RN if pt's daughter in law calls to get that number. CSW will fax pt out [place] [facility].  Employment status:  Retired Database administrator PT Recommendations:  Skilled Nursing Facility Information / Referral to community resources:  Skilled Nursing Facility  Patient/Family's Response to care:  Pt verbalized understanding of CSW role and expressed appreciation for support. Pt denies any concern regarding pt care at this time.   Patient/Family's Understanding of and Emotional Response to Diagnosis, Current Treatment, and Prognosis:  Pt understanding and realistic regarding physical limitations. Pt understands the need for SNF placement at d/c. Pt agreeable to SNF placement at d/c, at this time. Pt's responses emotionally appropriate during  conversation with CSW. Pt denies any concern regarding treatment plan at this time. CSW will continue to provide support and facilitate d/c needs.   Emotional Assessment Appearance:  Appears stated age Attitude/Demeanor/Rapport:  (Patient was appropriate) Affect (typically observed):  Accepting, Appropriate, Calm Orientation:  Oriented to Situation, Oriented to  Time, Oriented to Place, Oriented to Self Alcohol / Substance use:  Not Applicable Psych involvement (Current and /or in the community):  No (Comment)  Discharge Needs  Concerns to be addressed:  Basic Needs Readmission within the last 30 days:    Current discharge risk:  Dependent with Mobility Barriers to Discharge:  Continued Medical Work up   [employer], LCSW 02/22/2018, 10:04 AM

## 2018-02-22 NOTE — Care Management Important Message (Signed)
Important Message  Patient Details  Name: Calirose Dubuisson MRN: [id number] Date of Birth: 10-Jun-1933   Medicare Important Message Given:  Yes    Ziyad Dyar Stefan [place] 02/22/2018, 3:45 PM

## 2018-02-23 ENCOUNTER — Encounter (HOSPITAL_COMMUNITY): Payer: Self-pay | Admitting: Orthopedic Surgery

## 2018-02-23 LAB — CBC
HEMATOCRIT: 29.7 % — AB (ref 36.0–46.0)
HEMOGLOBIN: 9.1 g/dL — AB (ref 12.0–15.0)
MCH: 29.4 pg (ref 26.0–34.0)
MCHC: 30.6 g/dL (ref 30.0–36.0)
MCV: 96.1 fL (ref 78.0–100.0)
Platelets: 174 10*3/uL (ref 150–400)
RBC: 3.09 MIL/uL — AB (ref 3.87–5.11)
RDW: 13.8 % (ref 11.5–15.5)
WBC: 6 10*3/uL (ref 4.0–10.5)

## 2018-02-23 LAB — GLUCOSE, CAPILLARY
GLUCOSE-CAPILLARY: 99 mg/dL (ref 70–99)
GLUCOSE-CAPILLARY: 111 mg/dL — AB (ref 70–99)
GLUCOSE-CAPILLARY: 119 mg/dL — AB (ref 70–99)
Glucose-Capillary: 98 mg/dL (ref 70–99)

## 2018-02-23 LAB — BASIC METABOLIC PANEL
ANION GAP: 5 (ref 5–15)
BUN: 21 mg/dL (ref 8–23)
CALCIUM: 8.4 mg/dL — AB (ref 8.9–10.3)
CO2: 29 mmol/L (ref 22–32)
Chloride: 110 mmol/L (ref 98–111)
Creatinine, Ser: 1.09 mg/dL — ABNORMAL HIGH (ref 0.44–1.00)
GFR, EST AFRICAN AMERICAN: 53 mL/min — AB (ref >60)
GFR, EST NON AFRICAN AMERICAN: 46 mL/min — AB (ref >60)
Glucose, Bld: 111 mg/dL — ABNORMAL HIGH (ref 70–99)
POTASSIUM: 4.5 mmol/L (ref 3.5–5.1)
Sodium: 144 mmol/L (ref 135–145)

## 2018-02-23 MED ORDER — AMOXICILLIN-POT CLAVULANATE 500-125 MG PO TABS: 1.0000 | ORAL_TABLET | Freq: Two times a day (BID) | ORAL | Status: AC

## 2018-02-23 MED ORDER — BENAZEPRIL HCL 10 MG PO TABS: 10.0000 mg | ORAL_TABLET | Freq: Every day | ORAL | Status: AC

## 2018-02-23 MED ORDER — HYDRALAZINE HCL 50 MG PO TABS: 50.0000 mg | ORAL_TABLET | Freq: Three times a day (TID) | ORAL | Status: AC

## 2018-02-23 MED ORDER — RIVAROXABAN 10 MG PO TABS: 10.0000 mg | ORAL_TABLET | Freq: Every day | ORAL | Status: DC

## 2018-02-23 MED ORDER — HYDROCODONE-ACETAMINOPHEN 5-325 MG PO TABS: 1.0000 | ORAL_TABLET | Freq: Four times a day (QID) | ORAL | 0 refills | Status: DC | PRN

## 2018-02-23 NOTE — [place] FL2 (Signed)
[place] MEDICAID FL2 LEVEL OF CARE SCREENING TOOL     IDENTIFICATION  Patient Name: Ashley Mora Birthdate: 1932-09-06 Sex: female Admission Date (Current Location): 02/18/2018  [facility] and [name] Number:  [employer] and Address:  [facility], [address] [place], [place] [address]      Provider Number: [id number]  Attending Physician Name and Address:  Leroy Sea, MD  Relative Name and Phone Number:  Bjorn Loser [phone]    Current Level of Care: Hospital Recommended Level of Care: Skilled Nursing Facility Prior Approval Number:    Date Approved/Denied:   PASRR Number: [id number]  Discharge Plan: SNF    Current Diagnoses: Patient Active Problem List   Diagnosis Date Noted  . Ankle fracture 02/20/2018  . Open ankle fracture, left, type I or II, initial encounter 02/18/2018  . COPD (chronic obstructive pulmonary disease) (HCC) 02/18/2018  . Borderline diabetes mellitus 02/18/2018  . Essential hypertension 02/18/2018  . Pasteurella cellulitis due to cat bite 02/18/2018  . Renal failure 02/18/2018  . Tibial fracture 02/18/2018    Orientation RESPIRATION BLADDER Height & Weight     Self, Time, Situation, Place  O2(Nasal cannula 2l/min) Continent Weight: 195 lb (88.5 kg) Height:  5\' 2"  (157.5 cm)  BEHAVIORAL SYMPTOMS/MOOD NEUROLOGICAL BOWEL NUTRITION STATUS      Continent Diet(please see discharge summary)  AMBULATORY STATUS COMMUNICATION OF NEEDS Skin     Verbally Other (Comment)(incision(closed) hand right, incision(closed) ankle, left)                       Personal Care Assistance Level of Assistance  Feeding, Dressing, Bathing Bathing Assistance: Limited assistance Feeding assistance: Limited assistance Dressing Assistance: Limited assistance     Functional Limitations Info  Sight, Hearing, Speech Sight Info: Adequate Hearing Info: Adequate Speech Info: Adequate    SPECIAL CARE FACTORS FREQUENCY   PT (By licensed PT)     PT Frequency: 3x per week              Contractures      Additional Factors Info  Code Status, Allergies Code Status Info: FUll Allergies Info: Atorvastatin, Colchicine           Current Medications (02/23/2018):  This is the current hospital active medication list Current Facility-Administered Medications  Medication Dose Route Frequency Provider Last Rate Last Dose  . acetaminophen (TYLENOL) tablet 325-650 mg  325-650 mg Oral Q6H PRN Cammy Copa, MD   650 mg at 02/23/18 0618  . albuterol (PROVENTIL) (2.5 MG/3ML) 0.083% nebulizer solution 2.5 mg  2.5 mg Nebulization Q2H PRN Cammy Copa, MD      . ALPRAZolam Prudy Feeler) tablet 0.5 mg  0.5 mg Oral TID PRN Cammy Copa, MD   0.5 mg at 02/20/18 0218  . amLODipine (NORVASC) tablet 10 mg  10 mg Oral Daily Cammy Copa, MD   10 mg at 02/23/18 0954  . amoxicillin-clavulanate (AUGMENTIN) 500-125 MG per tablet 500 mg  1 tablet Oral BID Leroy Sea, MD   500 mg at 02/23/18 0954  . chlorhexidine (HIBICLENS) 4 % liquid 4 application  60 mL Topical Once Cammy Copa, MD      . cloNIDine (CATAPRES) tablet 0.2 mg  0.2 mg Oral BID Cammy Copa, MD   0.2 mg at 02/23/18 0955  . docusate sodium (COLACE) capsule 100 mg  100 mg Oral BID Cammy Copa, MD   100 mg at 02/23/18  [id number]  . fentaNYL (SUBLIMAZE) injection 50 mcg  50 mcg Intravenous Q1H PRN Cammy Copa, MD   50 mcg at 02/18/18 2240  . fluticasone furoate-vilanterol (BREO ELLIPTA) 100-25 MCG/INH 1 puff  1 puff Inhalation Daily Cammy Copa, MD   1 puff at 02/23/18 [phone]  . gabapentin (NEURONTIN) capsule 300 mg  300 mg Oral TID Cammy Copa, MD   300 mg at 02/23/18 1521  . hydrALAZINE (APRESOLINE) tablet 25 mg  25 mg Oral Q8H Leroy Sea, MD   25 mg at 02/23/18 1445  . HYDROcodone-acetaminophen (NORCO/VICODIN) 5-325 MG per tablet 1 tablet  1 tablet Oral Q6H PRN Leroy Sea, MD   1 tablet  at 02/23/18 1445  . insulin aspart (novoLOG) injection 0-9 Units  0-9 Units Subcutaneous TID WC Cammy Copa, MD   3 Units at 02/20/18 1719  . metoprolol succinate (TOPROL-XL) 24 hr tablet 50 mg  50 mg Oral Daily Cammy Copa, MD   50 mg at 02/23/18 0956  . morphine 2 MG/ML injection 0.5-1 mg  0.5-1 mg Intravenous Q2H PRN Cammy Copa, MD      . ondansetron [facility]) injection 4 mg  4 mg Intravenous Q6H PRN Cammy Copa, MD   4 mg at 02/22/18 1133  . pantoprazole (PROTONIX) EC tablet 40 mg  40 mg Oral Daily Cammy Copa, MD   40 mg at 02/23/18 0956  . povidone-iodine 10 % swab 2 application  2 application Topical Once Cammy Copa, MD      . rivaroxaban Carlena Hurl) tablet 10 mg  10 mg Oral Daily Link Snuffer B, RPH   10 mg at 02/22/18 2213  . umeclidinium bromide (INCRUSE ELLIPTA) 62.5 MCG/INH 1 puff  1 puff Inhalation Daily Cammy Copa, MD   1 puff at 02/23/18 0734     Discharge Medications: Please see discharge summary for a list of discharge medications.  Relevant Imaging Results:  Relevant Lab Results:   Additional Information SSN [id number]  Eduard Roux, LCSWA

## 2018-02-23 NOTE — Progress Notes (Signed)
Physical Therapy Treatment Patient Details Name: Ashley Mora MRN: [id number] DOB: 1932-12-25 Today's Date: 02/23/2018    History of Present Illness Pt is a 84 y.o. F with significant PMH of COPD on 3L O2, HTN, who fell going up steps and sustained an open left ankle fracture dislocation. Now s/p ORIF of left ankle.    PT Comments    Pt supine in bed just finished her supper and reporting fatigue.  She refused OOB mobility but agreeable to supine therapeutic exercises with B UEs and LEs.  Pt remains to require SNF placement and awaiting authorization for SNF placement.  Plan next session for functional mobility.      Follow Up Recommendations  SNF     Equipment Recommendations  Other (comment)(defer to next venue)    Recommendations for Other Services OT consult     Precautions / Restrictions Precautions Precautions: Fall Precaution Comments: wound vac Restrictions Weight Bearing Restrictions: Yes LLE Weight Bearing: Non weight bearing    Mobility  Bed Mobility               General bed mobility comments: Pt reports fatigue and refused OOB activity at this time.  Pt educated on the benefits of mobility and she remained to decline.  She did agree to supine exercises.    Transfers                    Ambulation/Gait                 Stairs             Wheelchair Mobility    Modified Rankin (Stroke Patients Only)       Balance     Sitting balance-Leahy Scale: Good       Standing balance-Leahy Scale: Poor                              Cognition Arousal/Alertness: Awake/alert Behavior During Therapy: WFL for tasks assessed/performed Overall Cognitive Status: Within Functional Limits for tasks assessed                                        Exercises Total Joint Exercises Ankle Circles/Pumps: AROM;Right;Supine;20 reps(encouraged patient to wiggle her toes on the L side.  ) Quad Sets: AROM;Both;10  reps;Supine Heel Slides: Both;10 reps;Supine;AAROM Hip ABduction/ADduction: AAROM;10 reps;Supine;Both Straight Leg Raises: AAROM;10 reps;Both;Supine Shoulder Exercises Shoulder Flexion: AROM;Both;10 reps;Supine Shoulder Extension: AROM;Both;10 reps;Supine Elbow Flexion: AROM;Both;10 reps;Supine(holding water bottle) Elbow Extension: AROM;Both;10 reps;Supine(holding water bottle) Hand Exercises Digit Composite Flexion: AROM;Both;10 reps;Supine Composite Extension: AROM;Both;10 reps;Supine Other Exercises Other Exercises: chest press 1x10 reps.     General Comments        Pertinent Vitals/Pain Pain Assessment: Faces Faces Pain Scale: Hurts even more Pain Location: left ankle Pain Descriptors / Indicators: Discomfort;Grimacing;Operative site guarding Pain Intervention(s): Monitored during session;Repositioned;Ice applied    Home Living                      Prior Function            PT Goals (current goals can now be found in the care plan section) Acute Rehab PT Goals Patient Stated Goal: go back to her house Potential to Achieve Goals: Good Progress towards PT goals: Progressing toward goals    Frequency    Min  3X/week      PT Plan Current plan remains appropriate    Co-evaluation              AM-PAC PT "6 Clicks" Daily Activity  Outcome Measure  Difficulty turning over in bed (including adjusting bedclothes, sheets and blankets)?: A Lot Difficulty moving from lying on back to sitting on the side of the bed? : Unable Difficulty sitting down on and standing up from a chair with arms (e.g., wheelchair, bedside commode, etc,.)?: Unable Help needed moving to and from a bed to chair (including a wheelchair)?: A Lot Help needed walking in hospital room?: Total Help needed climbing 3-5 steps with a railing? : Total 6 Click Score: 8    End of Session Equipment Utilized During Treatment: Oxygen Activity Tolerance: Patient tolerated treatment  well Patient left: in chair;with call bell/phone within reach Nurse Communication: Mobility status PT Visit Diagnosis: Unsteadiness on feet (R26.81);Other abnormalities of gait and mobility (R26.89);History of falling (Z91.81);Difficulty in walking, not elsewhere classified (R26.2);Pain Pain - Right/Left: Left Pain - part of body: Ankle and joints of foot     Time: 0981-1914 PT Time Calculation (min) (ACUTE ONLY): 20 min  Charges:  $Therapeutic Exercise: 8-22 mins                     Ashley Mora, PTA pager [phone]    Florestine Avers 02/23/2018, 5:04 PM

## 2018-02-23 NOTE — Progress Notes (Signed)
Subjective: Patient stable.  Pain controlled    Objective: Vital signs in last 24 hours: Temp:  [97.5 F (36.4 C)-98.3 F (36.8 C)] 97.5 F (36.4 C) (09/03 0536) Pulse Rate:  [55-63] 57 (09/03 0536) Resp:  [16] 16 (09/02 1400) BP: (136-150)/(51-59) 150/59 (09/03 0536) SpO2:  [95 %-100 %] 95 % (09/03 0736)  Intake/Output from previous day: 09/02 0701 - 09/03 0700 In: 960 [P.O.:960] Out: 2375 [Urine:2350; Drains:25] Intake/Output this shift: No intake/output data recorded.  Exam:  Dorsiflexion/Plantar flexion intact  Labs: Recent Labs    02/21/18 0453 02/22/18 0354 02/23/18 0337  HGB 9.2* 8.2* 9.1*   Recent Labs    02/22/18 0354 02/23/18 0337  WBC 5.9 6.0  RBC 2.77* 3.09*  HCT 26.8* 29.7*  PLT 147* 174   Recent Labs    02/21/18 0453 02/23/18 0337  NA 144 144  K 4.5 4.5  CL 111 110  CO2 26 29  BUN 27* 21  CREATININE 1.24* 1.09*  GLUCOSE 123* 111*  CALCIUM 8.5* 8.4*   No results for input(s): LABPT, INR in the last 72 hours.  Assessment/Plan:  Impression is patient is doing well following ankle fracture fixation.  Anticipate discharge today.  She will need to be changed over to [facility] handheld wound VAC.  She will need follow-up with me on Friday so I can take down the splint and remove the wound VAC's.  Discharge home on current pain medicine regimen along with Xarelto for DVT prophylaxis   G Dorene Grebe 02/23/2018, 12:31 PM

## 2018-02-23 NOTE — Discharge Instructions (Signed)
Follow with Primary MD [name], MD in 7 days   Get CBC, CMP,  checked  by Primary MD or SNF MD in 5-7 days   Activity: No weightbearing on the left leg, use full fall precautions use walker/cane & assistance as needed  Disposition SNF  Diet:    Heart Healthy  with feeding assistance and aspiration precautions.  For Heart failure patients - Check your Weight same time everyday, if you gain over 2 pounds, or you develop in leg swelling, experience more shortness of breath or chest pain, call your Primary MD immediately. Follow Cardiac Low Salt Diet and 1.5 lit/day fluid restriction.  Special Instructions: If you have smoked or chewed Tobacco  in the last 2 yrs please stop smoking, stop any regular Alcohol  and or any Recreational drug use.  On your next visit with your primary care physician please Get Medicines reviewed and adjusted.  Please request your Prim.MD to go over all Hospital Tests and Procedure/Radiological results at the follow up, please get all Hospital records sent to your Prim MD by signing hospital release before you go home.  If you experience worsening of your admission symptoms, develop shortness of breath, life threatening emergency, suicidal or homicidal thoughts you must seek medical attention immediately by calling 911 or calling your MD immediately  if symptoms less severe.  You Must read complete instructions/literature along with all the possible adverse reactions/side effects for all the Medicines you take and that have been prescribed to you. Take any new Medicines after you have completely understood and accpet all the possible adverse reactions/side effects.      Information on my medicine - XARELTO (Rivaroxaban)  Why was Xarelto prescribed for you? Xarelto was prescribed for you to reduce the risk of blood clots forming after orthopedic surgery. The medical term for these abnormal blood clots is venous thromboembolism (VTE).  What do you need to  know about xarelto ? Take your Xarelto ONCE DAILY at the same time every day. You may take it either with or without food.  If you have difficulty swallowing the tablet whole, you may crush it and mix in applesauce just prior to taking your dose.  Take Xarelto exactly as prescribed by your doctor and DO NOT stop taking Xarelto without talking to the doctor who prescribed the medication.  Stopping without other VTE prevention medication to take the place of Xarelto may increase your risk of developing a clot.  After discharge, you should have regular check-up appointments with your healthcare provider that is prescribing your Xarelto.    What do you do if you miss a dose? If you miss a dose, take it as soon as you remember on the same day then continue your regularly scheduled once daily regimen the next day. Do not take two doses of Xarelto on the same day.   Important Safety Information A possible side effect of Xarelto is bleeding. You should call your healthcare provider right away if you experience any of the following: ? Bleeding from an injury or your nose that does not stop. ? Unusual colored urine (red or dark brown) or unusual colored stools (red or black). ? Unusual bruising for unknown reasons. ? A serious fall or if you hit your head (even if there is no bleeding).  Some medicines may interact with Xarelto and might increase your risk of bleeding while on Xarelto. To help avoid this, consult your healthcare provider or pharmacist prior to using any new prescription or  non-prescription medications, including herbals, vitamins, non-steroidal anti-inflammatory drugs (NSAIDs) and supplements.  This website has more information on Xarelto: [email or web address].com.br.

## 2018-02-23 NOTE — Discharge Summary (Signed)
Ashley Mora MCN:[id number] DOB: 24-Feb-1933 DOA: 02/18/2018  PCP: Beckie Salts, MD  Admit date: 02/18/2018  Discharge date: 02/23/2018  Admitted From: Home   Disposition:  SNF   Recommendations for Outpatient Follow-up:   Follow up with PCP in 1-2 weeks  PCP Please obtain BMP/CBC, 2 view CXR in 1week,  (see Discharge instructions)   PCP Please follow up on the following pending results:    Home Health: None   Equipment/Devices: None  Consultations: Ortho Discharge Condition: Stable   CODE STATUS: Full   Diet Recommendation: Heart Healthy     Chief Complaint  Patient presents with  . Fall  . tib fib fx     Brief history of present illness from the day of admission and additional interim summary    Ashley Schneiders Wilsonis a 84 y.o.femalewith medical history significant of COPD on 3L home O2; preDM; HTN presenting with open fracture/dislocation.She was going into her house and she tripped over the threshold and fell. She landed on the steps and had an open fracture. She had a similar fracture on the right about 5 years ago.  She also had a cat bite. She jerked her hand when she bit. It looked like it was healing and about 3-4 days ago it started swelling. She did see the doctor about this today. She has an appointment with the orthopedist tomorrow regarding chronic progressive knee pain.  She was diagnosed with open left ankle fracture at home with right hand and arm cellulitis and admitted to the hospital.                                                                 Hospital Course      Open ankle fracture Left -  Mechanical fall resulting inanklefracture, seen by orthopedics she initially had external fixators placed on 02/18/2018 now underwent surgical correction on 02/20/2018, tolerated the  procedure well, no weightbearing on the left leg per orthopedics, continue to intermittently monitor H&H at SNF, discussed with orthopedics they would like Xarelto for DVT prophylaxis until patient is weightbearing and ambulating, patient agreeable for SNF will be discharged to SNF today if bed available.  Cellulitis of the L.arm/hand - after a recent cat bite, treated with Unasyn, cellulitis much improved, wound inspected by me today, sutures to come out in the next 7 days if healing continues to be well, antibiotic now switched to Augmentin with a stop date of 02/26/2018.  Renal failure - Uncertain baseline creatinine and no documentation available at this time, ACE inhibitor was held and she was gently hydrated , creatinine is down to 1.09, she is on Lasix and ACE inhibitor combination at home, Lasix has been resumed, ACE inhibitor dose has been cut in half, continue to monitor BMP at SNF intermittently.  Chronic respiratory failure due  to COPD, on home O2 - Continue home O2, nebulizer treatments as needed.  Stable no acute issues.    HTN - ACE inhibitor dose has been cut in half as above, currently on Norvasc, hydralazine and Toprol-XL, monitor blood pressure at SNF and continue to adjust as needed.   Anemia.  Combination of perioperative blood loss along with dilution, stable no need for transfusion.  Borderline DM - A1c is satisfactory on diet-controlled.    Discharge diagnosis     Principal Problem:   Open ankle fracture, left, type I or II, initial encounter Active Problems:   COPD (chronic obstructive pulmonary disease) (HCC)   Borderline diabetes mellitus   Essential hypertension   Pasteurella cellulitis due to cat bite   Renal failure   Tibial fracture   Ankle fracture    Discharge instructions    Discharge Instructions    Diet - low sodium heart healthy   Complete by:  As directed    Discharge instructions   Complete by:  As directed    Follow with Primary MD  Beckie Salts, MD in 7 days   Get CBC, CMP,  checked  by Primary MD or SNF MD in 5-7 days   Activity: No weightbearing in the left leg, use Full fall precautions use walker/cane & assistance as needed  Disposition SNF  Diet:    Heart Healthy  with feeding assistance and aspiration precautions.  For Heart failure patients - Check your Weight same time everyday, if you gain over 2 pounds, or you develop in leg swelling, experience more shortness of breath or chest pain, call your Primary MD immediately. Follow Cardiac Low Salt Diet and 1.5 lit/day fluid restriction.  Special Instructions: If you have smoked or chewed Tobacco  in the last 2 yrs please stop smoking, stop any regular Alcohol  and or any Recreational drug use.  On your next visit with your primary care physician please Get Medicines reviewed and adjusted.  Please request your Prim.MD to go over all Hospital Tests and Procedure/Radiological results at the follow up, please get all Hospital records sent to your Prim MD by signing hospital release before you go home.  If you experience worsening of your admission symptoms, develop shortness of breath, life threatening emergency, suicidal or homicidal thoughts you must seek medical attention immediately by calling 911 or calling your MD immediately  if symptoms less severe.  You Must read complete instructions/literature along with all the possible adverse reactions/side effects for all the Medicines you take and that have been prescribed to you. Take any new Medicines after you have completely understood and accpet all the possible adverse reactions/side effects.   Increase activity slowly   Complete by:  As directed       Discharge Medications   Allergies as of 02/23/2018      Reactions   Atorvastatin Other (See Comments)   Made arms very sore   Colchicine Other (See Comments)   Tape Other (See Comments)   Patient bruises VERY easily and skin is FRAGILE!!      Medication  List    STOP taking these medications   ALPRAZolam 0.5 MG tablet Commonly known as:  XANAX   cloNIDine 0.2 MG tablet Commonly known as:  CATAPRES   HYDROcodone-acetaminophen 7.5-325 MG tablet Commonly known as:  NORCO Replaced by:  HYDROcodone-acetaminophen 5-325 MG tablet   naproxen sodium 220 MG tablet Commonly known as:  ALEVE     TAKE these medications   amLODipine 10 MG  tablet Commonly known as:  NORVASC Take 10 mg by mouth daily.   amoxicillin-clavulanate 500-125 MG tablet Commonly known as:  AUGMENTIN Take 1 tablet (500 mg total) by mouth 2 (two) times daily for 2 days.   aspirin EC 81 MG tablet Take 81 mg by mouth daily.   B-12 COMPLIANCE INJECTION 1000 MCG/ML Kit Generic drug:  Cyanocobalamin Inject 1 mL as directed every 30 (thirty) days.   benazepril 10 MG tablet Commonly known as:  LOTENSIN Take 1 tablet (10 mg total) by mouth daily. What changed:    medication strength  how much to take   furosemide 20 MG tablet Commonly known as:  LASIX Take 20-40 mg by mouth See admin instructions. Take 40 mg by mouth in the morning and 20 mg in the evening   hydrALAZINE 50 MG tablet Commonly known as:  APRESOLINE Take 1 tablet (50 mg total) by mouth 3 (three) times daily.   HYDROcodone-acetaminophen 5-325 MG tablet Commonly known as:  NORCO/VICODIN Take 1 tablet by mouth every 6 (six) hours as needed for moderate pain or severe pain (pain score 4-6). Replaces:  HYDROcodone-acetaminophen 7.5-325 MG tablet   metoprolol succinate 50 MG 24 hr tablet Commonly known as:  TOPROL-XL Take 50 mg by mouth daily. Take with or immediately following a meal.   omeprazole 40 MG capsule Commonly known as:  PRILOSEC Take 40 mg by mouth daily.   OXYGEN Inhale 3 L into the lungs continuous.   PROAIR HFA 108 (90 Base) MCG/ACT inhaler Generic drug:  albuterol Inhale 2 puffs into the lungs every 6 (six) hours as needed for wheezing or shortness of breath.   albuterol  (2.5 MG/3ML) 0.083% nebulizer solution Commonly known as:  PROVENTIL Take 2.5 mg by nebulization every 6 (six) hours as needed for wheezing or shortness of breath.   rivaroxaban 10 MG Tabs tablet Commonly known as:  XARELTO Take 1 tablet (10 mg total) by mouth daily for 21 days.   TRELEGY ELLIPTA 100-62.5-25 MCG/INH Aepb Generic drug:  Fluticasone-Umeclidin-Vilant Inhale 1 puff into the lungs daily.       Follow-up Information    Marlou Sa, Tonna Corner, MD. Schedule an appointment as soon as possible for a visit in 1 week(s).   Specialty:  Orthopedic Surgery Contact information: [place] [address] [phone]        Beckie Salts, MD. Schedule an appointment as soon as possible for a visit in 1 week(s).   Specialty:  Internal Medicine          Major procedures and Radiology Reports - PLEASE review detailed and final reports thoroughly  -         Dg Tibia/fibula Left  Result Date: 02/18/2018 CLINICAL DATA:  External fixation of left lower leg. EXAM: LEFT TIBIA AND FIBULA - 2 VIEW; DG C-ARM 61-120 MIN COMPARISON:  02/18/2018 preoperative ankle radiographs. FINDINGS: Fine bony detail is limited by the C-arm fluoroscopic technique. A total of 16 seconds of fluoroscopic time was utilized for external fixation across an open fracture dislocation involving the left tibiotalar joint and malleoli. Improved anatomic alignment despite limited fine bony detail due to the C-arm fluoroscopic technique. IMPRESSION: External fixation across the patient's known trimalleolar fracture dislocation of the ankle joint. Improved alignment with external fixation hardware in place. Electronically Signed   By: Ashley Royalty M.D.   On: 02/18/2018 21:20   Dg Ankle 2 Views Left  Result Date: 02/20/2018 CLINICAL DATA:  84 year old female undergoing ORIF of bimalleolar  fracture. EXAM: LEFT ANKLE - 2 VIEW; DG C-ARM 61-120 MIN COMPARISON:  Prior radiographs 02/18/2018 FINDINGS:  Three intraoperative spot radiographs demonstrate removal of external fixation and ORIF of the bimalleolar fracture with application of a lateral plate and screw construct along the distal fibula, and placement of 2 cannulated lag screws complete with washers across the medial malleolar fracture. Irregularity of the soft tissues overlying the medial malleolus consistent with an open wound. IMPRESSION: ORIF of bimalleolar fracture as above. No evidence of immediate hardware complication. The previously noted external fixator has been removed. Electronically Signed   By: Jacqulynn Cadet M.D.   On: 02/20/2018 11:53   Dg Chest Portable 1 View  Result Date: 02/18/2018 CLINICAL DATA:  [address] at home, leg injury. EXAM: PORTABLE CHEST 1 VIEW COMPARISON:  Chest radiograph September 04, 2017 FINDINGS: Cardiac silhouette is moderately enlarged. Calcified aortic arch. Mild chronic interstitial changes with LEFT lung base bandlike density. Increased lung volumes. Biapical pleural thickening. No pneumothorax. Osteopenia. Soft tissue planes are non suspicious. IMPRESSION: 1. Increased lung volumes, possible COPD. LEFT lung base atelectasis/scarring. 2. Cardiomegaly. 3.  Aortic Atherosclerosis (ICD10-I70.0). Electronically Signed   By: Elon Alas M.D.   On: 02/18/2018 14:42   Dg Tibia/fibula Left Port  Result Date: 02/18/2018 CLINICAL DATA:  Post reduction of open left ankle fracture. EXAM: PORTABLE LEFT TIBIA AND FIBULA - 2 VIEW COMPARISON:  Plain film of the LEFT ankle from earlier same day. FINDINGS: Markedly improved alignment of the osseous structures of the LEFT ankle. Ankle mortise appear symmetric. Overlying casting in place. IMPRESSION: Markedly improved alignment of the osseous structures at the LEFT ankle. Electronically Signed   By: Franki Cabot M.D.   On: 02/18/2018 15:50   Dg Ankle Left Port  Result Date: 02/20/2018 CLINICAL DATA:  84 year old female status post ORIF of bimalleolar fracture EXAM:  PORTABLE LEFT ANKLE - 2 VIEW COMPARISON:  Intraoperative radiographs obtained earlier today FINDINGS: Interval surgical changes of ORIF of a distal fibular fracture with a lateral buttress plate and screw construct. No evidence of hardware complication. The medial malleolar fracture has been transfixed with 2 cannulated lag screws 1 of which passes through a washer. Alignment of the fracture fragments is significantly improved. A wound VAC is present as well as plaster splinting material. IMPRESSION: ORIF of a bimalleolar fracture without evidence of immediate hardware complication. Electronically Signed   By: Jacqulynn Cadet M.D.   On: 02/20/2018 11:54   Dg Ankle Left Port  Result Date: 02/18/2018 CLINICAL DATA:  Post reduction of open left ankle fracture. EXAM: PORTABLE LEFT ANKLE - 2 VIEW COMPARISON:  Plain films from earlier same day. FINDINGS: There has been markedly improved alignment of the osseous structures at the LEFT ankle. Ankle mortise now appears symmetric. Visualized portions of the hindfoot and midfoot appear intact and normally aligned. Overlying casting in place. IMPRESSION: Markedly improved alignment of the osseous structures at the LEFT ankle. Casting in place. Electronically Signed   By: Franki Cabot M.D.   On: 02/18/2018 15:51   Dg Ankle Left Port  Result Date: 02/18/2018 CLINICAL DATA:  84 year old female with a history tib-fib fracture after a fall EXAM: PORTABLE LEFT ANKLE - 2 VIEW COMPARISON:  None. FINDINGS: Osteopenia. Lateral talar fracture dislocation with fracture of the medial malleolus, lateral malleolus, and possible posterior malleolar fracture on the lateral view. Soft tissue swelling and subcutaneous gas. IMPRESSION: Open talar fracture dislocation, with plain film evidence of trimalleolar fracture and lateral talar dislocation. Osteopenia. Electronically Signed  By: Corrie Mckusick D.O.   On: 02/18/2018 14:42   Dg C-arm 1-60 Min  Result Date: 02/20/2018 CLINICAL  DATA:  84 year old female undergoing ORIF of bimalleolar fracture. EXAM: LEFT ANKLE - 2 VIEW; DG C-ARM 61-120 MIN COMPARISON:  Prior radiographs 02/18/2018 FINDINGS: Three intraoperative spot radiographs demonstrate removal of external fixation and ORIF of the bimalleolar fracture with application of a lateral plate and screw construct along the distal fibula, and placement of 2 cannulated lag screws complete with washers across the medial malleolar fracture. Irregularity of the soft tissues overlying the medial malleolus consistent with an open wound. IMPRESSION: ORIF of bimalleolar fracture as above. No evidence of immediate hardware complication. The previously noted external fixator has been removed. Electronically Signed   By: Jacqulynn Cadet M.D.   On: 02/20/2018 11:53   Dg C-arm 1-60 Min  Result Date: 02/20/2018 CLINICAL DATA:  84 year old female undergoing ORIF of bimalleolar fracture. EXAM: LEFT ANKLE - 2 VIEW; DG C-ARM 61-120 MIN COMPARISON:  Prior radiographs 02/18/2018 FINDINGS: Three intraoperative spot radiographs demonstrate removal of external fixation and ORIF of the bimalleolar fracture with application of a lateral plate and screw construct along the distal fibula, and placement of 2 cannulated lag screws complete with washers across the medial malleolar fracture. Irregularity of the soft tissues overlying the medial malleolus consistent with an open wound. IMPRESSION: ORIF of bimalleolar fracture as above. No evidence of immediate hardware complication. The previously noted external fixator has been removed. Electronically Signed   By: Jacqulynn Cadet M.D.   On: 02/20/2018 11:53   Dg C-arm 1-60 Min  Result Date: 02/18/2018 CLINICAL DATA:  External fixation of left lower leg. EXAM: LEFT TIBIA AND FIBULA - 2 VIEW; DG C-ARM 61-120 MIN COMPARISON:  02/18/2018 preoperative ankle radiographs. FINDINGS: Fine bony detail is limited by the C-arm fluoroscopic technique. A total of 16 seconds of  fluoroscopic time was utilized for external fixation across an open fracture dislocation involving the left tibiotalar joint and malleoli. Improved anatomic alignment despite limited fine bony detail due to the C-arm fluoroscopic technique. IMPRESSION: External fixation across the patient's known trimalleolar fracture dislocation of the ankle joint. Improved alignment with external fixation hardware in place. Electronically Signed   By: Ashley Royalty M.D.   On: 02/18/2018 21:20    Micro Results   Recent Results (from the past 240 hour(s))  Surgical pcr screen     Status: None   Collection Time: 02/19/18  5:08 PM  Result Value Ref Range Status   MRSA, PCR NEGATIVE NEGATIVE Final   Staphylococcus aureus NEGATIVE NEGATIVE Final    Comment: (NOTE) The Xpert SA Assay (FDA approved for NASAL specimens in patients 51 years of age and older), is one component of a comprehensive surveillance program. It is not intended to diagnose infection nor to guide or monitor treatment. Performed at [facility], [place] [address] [place], [place] [address]     Today   Subjective    Sashay Felling today has no headache,no chest abdominal pain,no new weakness tingling or numbness, feels much better.   Objective   Blood pressure (!) 150/59, pulse (!) 57, temperature (!) 97.5 F (36.4 C), temperature source Oral, resp. rate 16, height '5\' 2"'$  (1.575 m), weight 88.5 kg, SpO2 95 %.   Intake/Output Summary (Last 24 hours) at 02/23/2018 1033 Last data filed at 02/23/2018 0700 Gross per 24 hour  Intake 720 ml  Output 2375 ml  Net -1655 ml    Exam  Awake  Alert, Oriented X 3, No new F.N deficits, Normal affect [place].AT,PERRAL Supple Neck,No JVD, No cervical lymphadenopathy appriciated.  Symmetrical Chest wall movement, Good air movement bilaterally, CTAB RRR,No Gallops, Rubs or new Murmurs, No Parasternal Heave +ve B.Sounds, Abd Soft, No tenderness, No organomegaly appriciated, No rebound - guarding or  rigidity. No Cyanosis,  R wrist under bandage, L ankle/foot under bandage     Data Review   CBC w Diff:  Lab Results  Component Value Date   WBC 6.0 02/23/2018   HGB 9.1 (L) 02/23/2018   HCT 29.7 (L) 02/23/2018   PLT 174 02/23/2018   LYMPHOPCT 5 02/18/2018   MONOPCT 8 02/18/2018   EOSPCT 0 02/18/2018   BASOPCT 0 02/18/2018    CMP:  Lab Results  Component Value Date   NA 144 02/23/2018   K 4.5 02/23/2018   CL 110 02/23/2018   CO2 29 02/23/2018   BUN 21 02/23/2018   CREATININE 1.09 (H) 02/23/2018    Total Time in preparing paper work, data evaluation and todays exam - 29 minutes  Lala Lund M.D on 02/23/2018 at 10:33 AM  [facility]   Office  [phone]

## 2018-02-24 DIAGNOSIS — S82892B Other fracture of left lower leg, initial encounter for open fracture type I or II: Secondary | ICD-10-CM

## 2018-02-24 LAB — GLUCOSE, CAPILLARY
GLUCOSE-CAPILLARY: 115 mg/dL — AB (ref 70–99)
GLUCOSE-CAPILLARY: 96 mg/dL (ref 70–99)
Glucose-Capillary: 106 mg/dL — ABNORMAL HIGH (ref 70–99)

## 2018-02-24 MED ORDER — ALUM & MAG HYDROXIDE-SIMETH 200-200-20 MG/5ML PO SUSP
30.0000 mL | Freq: Four times a day (QID) | ORAL | Status: DC | PRN
  Administered 2018-02-24: 30 mL via ORAL
  Filled 2018-02-24: qty 30

## 2018-02-24 NOTE — Progress Notes (Signed)
PROGRESS NOTE  Ashley Mora XBJ:[id number] DOB: 1933-02-03 DOA: 02/18/2018 PCP: [name], MD  HPI/Recap of past 24 hours:  Denies pain, no fever Wound vac in place Right hand is improving, edema in right hand has resolved Daughter in law at bedside  Awaiting insurance authorization to be discharged to snf.  Assessment/Plan: Principal Problem:   Open ankle fracture, left, type I or II, initial encounter Active Problems:   COPD (chronic obstructive pulmonary disease) (HCC)   Borderline diabetes mellitus   Essential hypertension   Pasteurella cellulitis due to cat bite   Renal failure   Tibial fracture   Ankle fracture  Open ankle fracture Left -  Mechanical fall resulting inanklefracture, seen by orthopedics she initially had external fixators placed on 02/18/2018 now underwent surgical correction on 02/20/2018, tolerated the procedure well,  -no weightbearing on the left leg per orthopedics,Praveena handheld wound VAC.  - continue to intermittently monitor H&H at SNF,  -orthopedics Dr August Saucer they would like Xarelto for DVT prophylaxis until patient is weightbearing and ambulating -she is to see ortho Dr August Saucer on Friday 9/6 for " take down the splint and remove the wound VAC's." - patient agreeable for SNF will be discharged to SNF today if bed available. Awaiting insurance orthorization  Cellulitis of the L.arm/hand - after a recent cat bite, treated with Unasyn, Ortho Dr Eulah Pont I/d's right hand wound on 8/29 Per Dr Shellia Cleverly " She had a pocket of fluid collection with some purulent fluid dorsally I sent cultures. I performed an excisional debridement of devitalized deep tissue fascia muscle using scissors." I can not find the wound culture in chart,   cellulitis much improved, wound inspected by me today, no erythema, no edema, no drainage sutures to come out in the next 7 days if healing continues to be well, antibiotic now switched to Augmentin with a stop date of  02/26/2018.  Renal failure - Uncertain baseline creatinine and no documentation available at this time, ACE inhibitor was held and she was gently hydrated, creatinine is down to 1.09, she is on Lasix and ACE inhibitor combination at home, Lasix has been resumed, ACE inhibitor dose has been cut in half, continue to monitor BMP at SNF intermittently.  Chronic respiratory failure due to COPD, on home O2 - Continue home O2, nebulizer treatments as needed. Stable no acute issues.   HTN - ACE inhibitor dose has been cut in half as above, currently on Norvasc, hydralazine and Toprol-XL, monitor blood pressure at SNF and continue to adjust as needed.  Anemia. Combination of perioperative blood loss along with dilution, stable no need for transfusion.  Borderline DM -A1c is satisfactory ondiet-controlled.  Code Status: full  Family Communication: patient and daughter in law at bedside  Disposition Plan: snf, pending insurance authorization   Consultants:  Ortho DR Eulah Pont right hand  Dr August Saucer left ankle  Procedures:  Right hand wound I/D  Left ankle  Antibiotics:  As above   Objective: BP (!) 150/58 (BP Location: Left Arm)   Pulse 78   Temp 97.9 F (36.6 C) (Oral)   Resp 15   Ht 5\' 2"  (1.575 m)   Wt 88.5 kg   SpO2 98%   BMI 35.67 kg/m   Intake/Output Summary (Last 24 hours) at 02/24/2018 1035 Last data filed at 02/24/2018 0830 Gross per 24 hour  Intake 480 ml  Output 2250 ml  Net -1770 ml   Filed Weights   02/21/18 1112  Weight: 88.5 kg  Exam: Patient is examined daily including today on 02/24/2018, exams remain the same as of yesterday except that has changed    General:  NAD  Cardiovascular: RRR  Respiratory: CTABL  Abdomen: Soft/ND/NT, positive BS  Musculoskeletal: left ankle post op changes, + wound vac, right dorsal hand wound with suture, no erythema, no edema, no drainage  Neuro: alert, oriented   Data Reviewed: Basic Metabolic  Panel: Recent Labs  Lab 02/18/18 1404 02/19/18 0818 02/20/18 0358 02/21/18 0453 02/23/18 0337  NA 142 142 143 144 144  K 4.0 4.5 4.7 4.5 4.5  CL 107 110 112* 111 110  CO2 25 25 27 26 29   GLUCOSE 121* 223* 133* 123* 111*  BUN 40* 35* 33* 27* 21  CREATININE 1.72* 1.52* 1.42* 1.24* 1.09*  CALCIUM 8.8* 8.3* 8.5* 8.5* 8.4*  MG  --  1.9  --   --   --    Liver Function Tests: No results for input(s): AST, ALT, ALKPHOS, BILITOT, PROT, ALBUMIN in the last 168 hours. No results for input(s): LIPASE, AMYLASE in the last 168 hours. No results for input(s): AMMONIA in the last 168 hours. CBC: Recent Labs  Lab 02/18/18 1404 02/19/18 0818 02/20/18 0358 02/21/18 0453 02/22/18 0354 02/23/18 0337  WBC 26.1* 17.3* 14.3* 8.7 5.9 6.0  NEUTROABS 22.7*  --   --   --   --   --   HGB 11.5* 9.7* 9.3* 9.2* 8.2* 9.1*  HCT 36.0 30.8* 29.5* 30.6* 26.8* 29.7*  MCV 94.0 94.2 94.9 96.5 96.8 96.1  PLT 143* 129* 137* 140* 147* 174   Cardiac Enzymes:   No results for input(s): CKTOTAL, CKMB, CKMBINDEX, TROPONINI in the last 168 hours. BNP (last 3 results) No results for input(s): BNP in the last 8760 hours.  ProBNP (last 3 results) No results for input(s): PROBNP in the last 8760 hours.  CBG: Recent Labs  Lab 02/22/18 2134 02/23/18 0727 02/23/18 1122 02/23/18 1636 02/23/18 2133  GLUCAP 105* 99 111* 119* 98    Recent Results (from the past 240 hour(s))  Surgical pcr screen     Status: None   Collection Time: 02/19/18  5:08 PM  Result Value Ref Range Status   MRSA, PCR NEGATIVE NEGATIVE Final   Staphylococcus aureus NEGATIVE NEGATIVE Final    Comment: (NOTE) The Xpert SA Assay (FDA approved for NASAL specimens in patients 49 years of age and older), is one component of a comprehensive surveillance program. It is not intended to diagnose infection nor to guide or monitor treatment. Performed at [facility], [address], [place], [place] [address]      Studies: No results  found.  Scheduled Meds: . amLODipine  10 mg Oral Daily  . amoxicillin-clavulanate  1 tablet Oral BID  . chlorhexidine  60 mL Topical Once  . cloNIDine  0.2 mg Oral BID  . docusate sodium  100 mg Oral BID  . fluticasone furoate-vilanterol  1 puff Inhalation Daily  . gabapentin  300 mg Oral TID  . hydrALAZINE  25 mg Oral Q8H  . insulin aspart  0-9 Units Subcutaneous TID WC  . metoprolol succinate  50 mg Oral Daily  . pantoprazole  40 mg Oral Daily  . povidone-iodine  2 application Topical Once  . rivaroxaban  10 mg Oral Daily  . umeclidinium bromide  1 puff Inhalation Daily    Continuous Infusions:   Time spent: I have personally reviewed and interpreted on  02/24/2018 daily labs, imagings as  discussed above under date review session and assessment and plans.  I reviewed all nursing notes, pharmacy notes, consultant notes,  vitals, pertinent old records  I have discussed plan of care as described above with RN , patient and family on 02/24/2018   Albertine Grates MD, PhD  [facility] Pager [phone]. If 7PM-7AM, please contact night-coverage at [email or web address], password [employer] 02/24/2018, 10:35 AM  LOS: 6 days

## 2018-02-25 LAB — GLUCOSE, CAPILLARY
GLUCOSE-CAPILLARY: 110 mg/dL — AB (ref 70–99)
Glucose-Capillary: 105 mg/dL — ABNORMAL HIGH (ref 70–99)

## 2018-02-25 MED ORDER — POLYETHYLENE GLYCOL 3350 17 G PO PACK
17.0000 g | PACK | Freq: Every day | ORAL | Status: DC
  Administered 2018-02-25: 17 g via ORAL
  Filled 2018-02-25: qty 1

## 2018-02-25 MED ORDER — SENNOSIDES-DOCUSATE SODIUM 8.6-50 MG PO TABS
1.0000 | ORAL_TABLET | Freq: Two times a day (BID) | ORAL | Status: DC
  Administered 2018-02-25: 1 via ORAL
  Filled 2018-02-25: qty 1

## 2018-02-25 NOTE — Progress Notes (Signed)
Wound vac changed to provena.  EMS here to transport patient at this time

## 2018-02-25 NOTE — Progress Notes (Signed)
Discharge summary is done by Dr Thedore Mins on 9/3. patient condition has been stable, unchanged.  No addendum needed expect discharge date is 9/5 instead of 9/3.Marland Kitchen

## 2018-02-25 NOTE — Progress Notes (Signed)
Physical Therapy Treatment Patient Details Name: Ashley Mora MRN: [id number] DOB: May 28, 1933 Today's Date: 02/25/2018    History of Present Illness Pt is a 84 y.o. F with significant PMH of COPD on 3L O2, HTN, who fell going up steps and sustained an open left ankle fracture dislocation. Now s/p ORIF of left ankle.    PT Comments    Pt performed LE strengthening exercises, bed mobility and slide board transfer OOB to recliner chair.  Improved ease noted with slide board transfer.  Pt remains to benefit from skilled nursing placement to improve strength and function before returning home.  Plan for d/c to rehab today.    Follow Up Recommendations  SNF     Equipment Recommendations  Other (comment)(defer to next venue)    Recommendations for Other Services       Precautions / Restrictions Precautions Precautions: Fall Precaution Comments: wound vac Restrictions Weight Bearing Restrictions: Yes LLE Weight Bearing: Non weight bearing    Mobility  Bed Mobility Overal bed mobility: Needs Assistance Bed Mobility: Supine to Sit;Rolling Rolling: Mod assist;+2 for safety/equipment   Supine to sit: Mod assist;+2 for safety/equipment     General bed mobility comments: Pt required assist with bed pad to roll to R and left side for pericare and pad replacement post bowel incontinence.  Pt performed supine to sit with  moderate assistance to advance LEs to edge of bed, advance hips with bed pad, and elevate trunk into sitting.  HOB elevated and heavy use of railing.    Transfers Overall transfer level: Needs assistance Equipment used: Sliding board Transfers: Lateral/Scoot Transfers          Lateral/Scoot Transfers: With slide board;Min assist;+2 physical assistance General transfer comment: Cues for hand placement, maintaining weight bearing restriction and pushing with UEs to assist with transfers.    Ambulation/Gait                 Stairs              Wheelchair Mobility    Modified Rankin (Stroke Patients Only)       Balance     Sitting balance-Leahy Scale: Fair       Standing balance-Leahy Scale: (NA)                              Cognition Arousal/Alertness: Awake/alert Behavior During Therapy: WFL for tasks assessed/performed Overall Cognitive Status: Within Functional Limits for tasks assessed                                        Exercises General Exercises - Lower Extremity Ankle Circles/Pumps: AROM;Both;10 reps;Supine(only able to perform toe flexion with L ankle due to injury.  ) Quad Sets: AROM;Both;10 reps;Supine Heel Slides: AROM;AAROM;Both;10 reps;Supine(assist for L side) Hip ABduction/ADduction: AROM;AAROM;Both;10 reps;Supine(assistance for L side) Straight Leg Raises: AROM;Both;10 reps;Supine(assistance for L side)    General Comments        Pertinent Vitals/Pain Pain Assessment: 0-10 Faces Pain Scale: Hurts even more Pain Location: left ankle Pain Descriptors / Indicators: Discomfort;Grimacing;Operative site guarding Pain Intervention(s): Monitored during session;Repositioned    Home Living                      Prior Function            PT Goals (current goals can  now be found in the care plan section) Acute Rehab PT Goals Patient Stated Goal: go back to her house Potential to Achieve Goals: Good Progress towards PT goals: Progressing toward goals    Frequency    Min 3X/week      PT Plan Current plan remains appropriate    Co-evaluation              AM-PAC PT "6 Clicks" Daily Activity  Outcome Measure  Difficulty turning over in bed (including adjusting bedclothes, sheets and blankets)?: A Lot Difficulty moving from lying on back to sitting on the side of the bed? : Unable Difficulty sitting down on and standing up from a chair with arms (e.g., wheelchair, bedside commode, etc,.)?: Unable Help needed moving to and from a bed  to chair (including a wheelchair)?: A Lot Help needed walking in hospital room?: Total Help needed climbing 3-5 steps with a railing? : Total 6 Click Score: 8    End of Session Equipment Utilized During Treatment: Oxygen(bed pad and slide board to mobilize.  ) Activity Tolerance: Patient tolerated treatment well Patient left: in chair;with call bell/phone within reach Nurse Communication: Mobility status PT Visit Diagnosis: Unsteadiness on feet (R26.81);Other abnormalities of gait and mobility (R26.89);History of falling (Z91.81);Difficulty in walking, not elsewhere classified (R26.2);Pain Pain - Right/Left: Left Pain - part of body: Ankle and joints of foot     Time: 6962-9528 PT Time Calculation (min) (ACUTE ONLY): 33 min  Charges:  $Therapeutic Exercise: 8-22 mins $Therapeutic Activity: 8-22 mins                     Joycelyn Rua, PTA pager [phone]    Ashley Mora 02/25/2018, 9:32 AM

## 2018-02-25 NOTE — Progress Notes (Signed)
Pt will DC to: [facility] DC date:02/25/2018 Family notified: Rhonda(daughter in Social worker) Transport LH:[id number]   RN, patient, and facility notified of DC. Discharge Summary sent to facility. RN given number for [phone]. DC packet on chart. Ambulance transport requested for patient.   CSW signing off. Antony Blackbird, [facility] Clinical Social Worker [phone]

## 2018-02-25 NOTE — Clinical Social Work Placement (Signed)
   CLINICAL SOCIAL WORK PLACEMENT  NOTE  Date:  02/25/2018  Patient Details  Name: Ashley Mora MRN: [id number] Date of Birth: April 04, 1933  Clinical Social Work is seeking post-discharge placement for this patient at the Skilled  Nursing Facility level of care (*CSW will initial, date and re-position this form in  chart as items are completed):  Yes   Patient/family provided with [facility] Clinical Social Work Department's list of facilities offering this level of care within the geographic area requested by the patient (or if unable, by the patient's family).  Yes   Patient/family informed of their freedom to choose among providers that offer the needed level of care, that participate in Medicare, Medicaid or managed care program needed by the patient, have an available bed and are willing to accept the patient.  Yes   Patient/family informed of [facility]'s ownership interest in [facility] and [facility], as well as of the fact that they are under no obligation to receive care at these facilities.  PASRR submitted to EDS on       PASRR number received on 02/23/18     Existing PASRR number confirmed on       FL2 transmitted to all facilities in geographic area requested by pt/family on 02/23/18     FL2 transmitted to all facilities within larger geographic area on       Patient informed that his/her managed care company has contracts with or will negotiate with certain facilities, including the following:        Yes   Patient/family informed of bed offers received.  Patient chooses bed at [facility]     Physician recommends and patient chooses bed at      Patient to be transferred to [facility] on 02/25/18.  Patient to be transferred to facility by [facility]     Patient family notified on 02/25/18 of transfer.  Name of family member notified:  Bjorn Loser( daughter in law)     PHYSICIAN Please prepare priority discharge summary,  including medications     Additional Comment:    _______________________________________________ Eduard Roux, LCSWA 02/25/2018, 11:32 AM

## 2018-02-26 ENCOUNTER — Encounter (INDEPENDENT_AMBULATORY_CARE_PROVIDER_SITE_OTHER): Payer: Self-pay | Admitting: Orthopedic Surgery

## 2018-02-26 ENCOUNTER — Telehealth (INDEPENDENT_AMBULATORY_CARE_PROVIDER_SITE_OTHER): Payer: Self-pay

## 2018-02-26 ENCOUNTER — Ambulatory Visit (INDEPENDENT_AMBULATORY_CARE_PROVIDER_SITE_OTHER): Payer: Medicare Other | Admitting: Orthopedic Surgery

## 2018-02-26 ENCOUNTER — Other Ambulatory Visit: Payer: Self-pay

## 2018-02-26 ENCOUNTER — Ambulatory Visit (INDEPENDENT_AMBULATORY_CARE_PROVIDER_SITE_OTHER): Payer: Self-pay

## 2018-02-26 ENCOUNTER — Encounter: Payer: Self-pay | Admitting: Internal Medicine

## 2018-02-26 ENCOUNTER — Non-Acute Institutional Stay (SKILLED_NURSING_FACILITY): Payer: Medicare Other | Admitting: Internal Medicine

## 2018-02-26 ENCOUNTER — Emergency Department (HOSPITAL_COMMUNITY): Payer: Medicare Other

## 2018-02-26 ENCOUNTER — Emergency Department (HOSPITAL_COMMUNITY)
Admission: EM | Admit: 2018-02-26 | Discharge: 2018-02-27 | Disposition: A | Discharge status: Home / Self Care | Payer: Medicare Other | Attending: Emergency Medicine | Admitting: Emergency Medicine

## 2018-02-26 ENCOUNTER — Encounter (HOSPITAL_COMMUNITY): Payer: Self-pay

## 2018-02-26 DIAGNOSIS — J449 Chronic obstructive pulmonary disease, unspecified: Secondary | ICD-10-CM

## 2018-02-26 DIAGNOSIS — M25552 Pain in left hip: Secondary | ICD-10-CM

## 2018-02-26 DIAGNOSIS — S82892B Other fracture of left lower leg, initial encounter for open fracture type I or II: Secondary | ICD-10-CM | POA: Diagnosis not present

## 2018-02-26 DIAGNOSIS — S82302E Unspecified fracture of lower end of left tibia, subsequent encounter for open fracture type I or II with routine healing: Secondary | ICD-10-CM

## 2018-02-26 DIAGNOSIS — I10 Essential (primary) hypertension: Secondary | ICD-10-CM

## 2018-02-26 DIAGNOSIS — S82892E Other fracture of left lower leg, subsequent encounter for open fracture type I or II with routine healing: Secondary | ICD-10-CM | POA: Diagnosis not present

## 2018-02-26 DIAGNOSIS — Z79899 Other long term (current) drug therapy: Secondary | ICD-10-CM | POA: Insufficient documentation

## 2018-02-26 DIAGNOSIS — N179 Acute kidney failure, unspecified: Secondary | ICD-10-CM

## 2018-02-26 DIAGNOSIS — M25562 Pain in left knee: Secondary | ICD-10-CM | POA: Diagnosis not present

## 2018-02-26 DIAGNOSIS — E538 Deficiency of other specified B group vitamins: Secondary | ICD-10-CM

## 2018-02-26 DIAGNOSIS — J9611 Chronic respiratory failure with hypoxia: Secondary | ICD-10-CM

## 2018-02-26 DIAGNOSIS — W5501XA Bitten by cat, initial encounter: Secondary | ICD-10-CM

## 2018-02-26 DIAGNOSIS — Z87891 Personal history of nicotine dependence: Secondary | ICD-10-CM | POA: Diagnosis not present

## 2018-02-26 DIAGNOSIS — E860 Dehydration: Secondary | ICD-10-CM | POA: Insufficient documentation

## 2018-02-26 DIAGNOSIS — M25561 Pain in right knee: Secondary | ICD-10-CM

## 2018-02-26 DIAGNOSIS — D62 Acute posthemorrhagic anemia: Secondary | ICD-10-CM | POA: Diagnosis not present

## 2018-02-26 DIAGNOSIS — R6883 Chills (without fever): Secondary | ICD-10-CM | POA: Diagnosis present

## 2018-02-26 DIAGNOSIS — G8929 Other chronic pain: Secondary | ICD-10-CM

## 2018-02-26 DIAGNOSIS — A280 Pasteurellosis: Secondary | ICD-10-CM | POA: Diagnosis not present

## 2018-02-26 DIAGNOSIS — K219 Gastro-esophageal reflux disease without esophagitis: Secondary | ICD-10-CM

## 2018-02-26 LAB — CBC WITH DIFFERENTIAL/PLATELET
BASOS ABS: 0 10*3/uL (ref 0.0–0.1)
Basophils Relative: 0 %
EOS PCT: 1 %
Eosinophils Absolute: 0.1 10*3/uL (ref 0.0–0.7)
HCT: 34.7 % — ABNORMAL LOW (ref 36.0–46.0)
Hemoglobin: 11.4 g/dL — ABNORMAL LOW (ref 12.0–15.0)
LYMPHS PCT: 10 %
Lymphs Abs: 1 10*3/uL (ref 0.7–4.0)
MCH: 30.2 pg (ref 26.0–34.0)
MCHC: 32.9 g/dL (ref 30.0–36.0)
MCV: 91.8 fL (ref 78.0–100.0)
MONO ABS: 0.9 10*3/uL (ref 0.1–1.0)
Monocytes Relative: 9 %
NEUTROS ABS: 7.5 10*3/uL (ref 1.7–7.7)
Neutrophils Relative %: 80 %
PLATELETS: 322 10*3/uL (ref 150–400)
RBC: 3.78 MIL/uL — ABNORMAL LOW (ref 3.87–5.11)
RDW: 14.3 % (ref 11.5–15.5)
WBC: 9.4 10*3/uL (ref 4.0–10.5)

## 2018-02-26 LAB — BASIC METABOLIC PANEL
ANION GAP: 12 (ref 5–15)
BUN: 28 mg/dL — ABNORMAL HIGH (ref 8–23)
CALCIUM: 9.5 mg/dL (ref 8.9–10.3)
CO2: 29 mmol/L (ref 22–32)
Chloride: 104 mmol/L (ref 98–111)
Creatinine, Ser: 1.14 mg/dL — ABNORMAL HIGH (ref 0.44–1.00)
GFR, EST AFRICAN AMERICAN: 50 mL/min — AB (ref >60)
GFR, EST NON AFRICAN AMERICAN: 43 mL/min — AB (ref >60)
Glucose, Bld: 112 mg/dL — ABNORMAL HIGH (ref 70–99)
POTASSIUM: 4 mmol/L (ref 3.5–5.1)
Sodium: 145 mmol/L (ref 135–145)

## 2018-02-26 LAB — URINALYSIS, ROUTINE W REFLEX MICROSCOPIC
BILIRUBIN URINE: NEGATIVE
GLUCOSE, UA: NEGATIVE mg/dL
HGB URINE DIPSTICK: NEGATIVE
KETONES UR: NEGATIVE mg/dL
LEUKOCYTES UA: NEGATIVE
NITRITE: NEGATIVE
PROTEIN: NEGATIVE mg/dL
Specific Gravity, Urine: 1.005 (ref 1.005–1.030)
pH: 8 (ref 5.0–8.0)

## 2018-02-26 MED ORDER — AMOXICILLIN-POT CLAVULANATE 500-125 MG PO TABS
500.0000 mg | ORAL_TABLET | Freq: Once | ORAL | Status: AC
  Administered 2018-02-26: 500 mg via ORAL
  Filled 2018-02-26: qty 1

## 2018-02-26 MED ORDER — HYDROCODONE-ACETAMINOPHEN 5-325 MG PO TABS
1.0000 | ORAL_TABLET | Freq: Once | ORAL | Status: AC
  Administered 2018-02-26: 1 via ORAL
  Filled 2018-02-26: qty 1

## 2018-02-26 MED ORDER — SODIUM CHLORIDE 0.9 % IV BOLUS
500.0000 mL | Freq: Once | INTRAVENOUS | Status: AC
  Administered 2018-02-26: 500 mL via INTRAVENOUS

## 2018-02-26 MED ORDER — FUROSEMIDE 40 MG PO TABS
20.0000 mg | ORAL_TABLET | Freq: Once | ORAL | Status: AC
  Administered 2018-02-26: 20 mg via ORAL
  Filled 2018-02-26: qty 1

## 2018-02-26 MED ORDER — HYDRALAZINE HCL 50 MG PO TABS
50.0000 mg | ORAL_TABLET | Freq: Once | ORAL | Status: AC
  Administered 2018-02-26: 50 mg via ORAL
  Filled 2018-02-26: qty 1

## 2018-02-26 MED ORDER — CLONIDINE HCL 0.1 MG PO TABS
0.1000 mg | ORAL_TABLET | Freq: Once | ORAL | Status: AC
  Administered 2018-02-26: 0.1 mg via ORAL
  Filled 2018-02-26: qty 1

## 2018-02-26 NOTE — ED Provider Notes (Signed)
[facility] DEPT Provider Note   CSN: [id number] Arrival date & time: 02/26/18  1100     History   Chief Complaint Chief Complaint  Patient presents with  . Chills    HPI Ashley Mora is a 83 y.o. female.  Patient was sent over from the orthopedics office today because the office orthopedic physician felt like the patient may have an infection she was complaining of chills.  Patient and her ankle with orthopedic surgeon was looking at.  He stated that that seemed fine.  She also was complaining of left hip pain which the orthopedic surgeon wanted a CT of the left hip.  Patient just complains of some chills.  The history is provided by the patient. No language interpreter was used.  Illness  This is a new problem. The current episode started more than 2 days ago. The problem occurs constantly. The problem has not changed since onset.Pertinent negatives include no chest pain, no abdominal pain and no headaches. Nothing aggravates the symptoms. Nothing relieves the symptoms. She has tried nothing for the symptoms. The treatment provided no relief.    Past Medical History:  Diagnosis Date  . B12 deficiency   . Borderline diabetes   . COPD (chronic obstructive pulmonary disease) ([place])    3L home O2  . HTN (hypertension)     Patient Active Problem List   Diagnosis Date Noted  . Ankle fracture 02/20/2018  . Open ankle fracture, left, type I or II, initial encounter 02/18/2018  . COPD (chronic obstructive pulmonary disease) ([place]) 02/18/2018  . Borderline diabetes mellitus 02/18/2018  . Essential hypertension 02/18/2018  . Pasteurella cellulitis due to cat bite 02/18/2018  . Renal failure 02/18/2018  . Tibial fracture 02/18/2018    Past Surgical History:  Procedure Laterality Date  . ANKLE FRACTURE SURGERY Right   . APPLICATION OF WOUND VAC Left 02/18/2018   Procedure: APPLICATION OF WOUND VAC;  Surgeon: Meredith Pel, MD;  Location: [place];  Service: Orthopedics;  Laterality: Left;  . EXTERNAL FIXATION LEG Left 02/18/2018   Procedure: EXTERNAL FIXATION LEG;  Surgeon: Meredith Pel, MD;  Location: [place];  Service: Orthopedics;  Laterality: Left;  . EXTERNAL FIXATION REMOVAL Left 02/20/2018   Procedure: REMOVAL EXTERNAL FIXATION LEG;  Surgeon: Meredith Pel, MD;  Location: [place];  Service: Orthopedics;  Laterality: Left;  . I&D EXTREMITY Left 02/18/2018   Procedure: IRRIGATION AND DEBRIDEMENT EXTREMITY;  Surgeon: Meredith Pel, MD;  Location: [place];  Service: Orthopedics;  Laterality: Left;  . I&D EXTREMITY Left 02/20/2018   Procedure: IRRIGATION AND DEBRIDEMENT LEG;  Surgeon: Meredith Pel, MD;  Location: [place];  Service: Orthopedics;  Laterality: Left;  . INCISION AND DRAINAGE OF WOUND Right 02/18/2018   Procedure: IRRIGATION AND DEBRIDEMENT WOUND;  Surgeon: Meredith Pel, MD;  Location: [place];  Service: Orthopedics;  Laterality: Right;  . ORIF ANKLE FRACTURE Left 02/20/2018   Procedure: OPEN REDUCTION INTERNAL FIXATION (ORIF) ANKLE FRACTURE;  Surgeon: Meredith Pel, MD;  Location: [facility];  Service: Orthopedics;  Laterality: Left;     OB History   None      Home Medications    Prior to Admission medications   Medication Sig Start Date End Date Taking? Authorizing Provider  albuterol (PROAIR HFA) 108 (90 Base) MCG/ACT inhaler Inhale 2 puffs into the lungs every 6 (six) hours as needed for wheezing or shortness of breath.   Yes [provider]  albuterol (PROVENTIL) (  2.5 MG/3ML) 0.083% nebulizer solution Take 2.5 mg by nebulization every 6 (six) hours as needed for wheezing or shortness of breath.   Yes [provider]  amLODipine (NORVASC) 10 MG tablet Take 10 mg by mouth daily.   Yes [provider]  amoxicillin-clavulanate (AUGMENTIN) 500-125 MG tablet Take 1 tablet by mouth 2 (two) times daily.   Yes [provider]  aspirin EC 81 MG tablet Take 81 mg by  mouth daily.   Yes [provider]  benazepril (LOTENSIN) 10 MG tablet Take 1 tablet (10 mg total) by mouth daily. 02/23/18  Yes Thurnell Lose, MD  bisacodyl (DULCOLAX) 10 MG suppository Place 10 mg rectally as needed for moderate constipation (for constipation unrelieved by MOM.).   Yes [provider]  Cyanocobalamin (B-12 COMPLIANCE INJECTION) 1000 MCG/ML KIT Inject 1 mL as directed every 30 (thirty) days.   Yes [provider]  Fluticasone-Umeclidin-Vilant (TRELEGY ELLIPTA) 100-62.5-25 MCG/INH AEPB Inhale 1 puff into the lungs daily.   Yes [provider]  furosemide (LASIX) 20 MG tablet Take 20-40 mg by mouth See admin instructions. Take 40 mg every morning and 20 mg every afternoon at 1630.   Yes [provider]  hydrALAZINE (APRESOLINE) 50 MG tablet Take 1 tablet (50 mg total) by mouth 3 (three) times daily. 02/23/18  Yes Thurnell Lose, MD  HYDROcodone-acetaminophen (NORCO) 5-325 MG tablet Take 1 tablet by mouth every 6 (six) hours as needed for moderate pain.   Yes [provider]  magnesium hydroxide (MILK OF MAGNESIA) 400 MG/5ML suspension Take 30 mLs by mouth daily as needed for mild constipation.   Yes [provider]  metoprolol succinate (TOPROL-XL) 50 MG 24 hr tablet Take 50 mg by mouth daily. Take with or immediately following a meal.   Yes [provider]  omeprazole (PRILOSEC) 40 MG capsule Take 40 mg by mouth daily.   Yes [provider]  OXYGEN Inhale 3 L into the lungs continuous.   Yes [provider]  rivaroxaban (XARELTO) 10 MG TABS tablet Take 1 tablet (10 mg total) by mouth daily for 21 days. 02/23/18 03/16/18 Yes Thurnell Lose, MD    Family History Family History  Problem Relation Age of Onset  . Diabetes Mother     Social History Social History   Tobacco Use  . Smoking status: Former Smoker    Packs/day: 1.00    Years: 50.00    Pack years: 50.00    Last attempt to  quit: 2000    Years since quitting: 19.6  . Smokeless tobacco: Never Used  Substance Use Topics  . Alcohol use: Never    Frequency: Never  . Drug use: Never     Allergies   Atorvastatin; Colchicine; and Tape   Review of Systems Review of Systems  Constitutional: Positive for chills. Negative for appetite change and fatigue.  HENT: Negative for congestion, ear discharge and sinus pressure.   Eyes: Negative for discharge.  Respiratory: Negative for cough.   Cardiovascular: Negative for chest pain.  Gastrointestinal: Negative for abdominal pain and diarrhea.  Genitourinary: Negative for frequency and hematuria.  Musculoskeletal: Negative for back pain.  Skin: Negative for rash.  Neurological: Negative for seizures and headaches.       Left hip pain  Psychiatric/Behavioral: Negative for hallucinations.     Physical Exam Updated Vital Signs BP (!) 161/61   Pulse 75   Temp 98.2 F (36.8 C) (Oral)   Resp 17  Ht '5\' 2"'$  (1.575 m)   Wt 88.5 kg   SpO2 99%   BMI 35.67 kg/m   Physical Exam  Constitutional: She is oriented to person, place, and time. She appears well-developed.  HENT:  Head: Normocephalic.  Eyes: Conjunctivae and EOM are normal. No scleral icterus.  Neck: Neck supple. No thyromegaly present.  Cardiovascular: Normal rate and regular rhythm. Exam reveals no gallop and no friction rub.  No murmur heard. Pulmonary/Chest: No stridor. She has no wheezes. She has no rales. She exhibits no tenderness.  Abdominal: She exhibits no distension. There is no tenderness. There is no rebound.  Musculoskeletal: Normal range of motion. She exhibits no edema.  Mild hip tenderness.  Patient has had surgery to her left ankle and has a boot on that.  This is been examined by her orthopedic surgeon earlier today and he stated was doing well  Lymphadenopathy:    She has no cervical adenopathy.  Neurological: She is oriented to person, place, and time. She exhibits normal muscle  tone. Coordination normal.  Skin: No rash noted. No erythema.  Psychiatric: She has a normal mood and affect. Her behavior is normal.     ED Treatments / Results  Labs (all labs ordered are listed, but only abnormal results are displayed) Labs Reviewed  CBC WITH DIFFERENTIAL/PLATELET - Abnormal; Notable for the following components:      Result Value   RBC 3.78 (*)    Hemoglobin 11.4 (*)    HCT 34.7 (*)    All other components within normal limits  BASIC METABOLIC PANEL - Abnormal; Notable for the following components:   Glucose, Bld 112 (*)    BUN 28 (*)    Creatinine, Ser 1.14 (*)    GFR calc non Af Amer 43 (*)    GFR calc Af Amer 50 (*)    All other components within normal limits  URINALYSIS, ROUTINE W REFLEX MICROSCOPIC - Abnormal; Notable for the following components:   Color, Urine STRAW (*)    Bacteria, UA RARE (*)    All other components within normal limits    EKG None  Radiology Ct Hip Left Wo Contrast  Result Date: 02/26/2018 CLINICAL DATA:  Fall left week with ankle fracture and surgery. Low-grade fever. Persistent hip pain with reported abnormal outside radiograph. EXAM: CT OF THE LEFT HIP WITHOUT CONTRAST TECHNIQUE: Multidetector CT imaging of the left hip was performed according to the standard protocol. Multiplanar CT image reconstructions were also generated. COMPARISON:  One-view pelvis 02/26/2018.  Pelvic CT 07/28/2017. FINDINGS: Bones/Joint/Cartilage Examination is limited to the inferior left hemipelvis and left hip. The bones appear demineralized. No evidence of acute fracture or dislocation. There is mild spurring of the greater trochanter. There are mild degenerative changes at the symphysis pubis and left sacroiliac joint. There is a small left hip joint effusion. Ligaments Suboptimally assessed by CT. Muscles and Tendons Mild gluteus muscular atrophy.  No tendon abnormality identified. Soft tissues No periarticular hematoma or fluid collection. There is  minimal soft tissue stranding in the subcutaneous fat lateral to the left hip. Iliofemoral atherosclerosis and mild distal colonic diverticulosis noted. IMPRESSION: 1. No evidence of acute left hip fracture or dislocation. 2. Small left hip joint effusion. Mild degenerative changes at the left sacroiliac joint and symphysis pubis. Electronically Signed   By: Richardean Sale M.D.   On: 02/26/2018 12:02    Procedures Procedures (including critical care time)  Medications Ordered in ED Medications  sodium chloride 0.9 %  bolus 500 mL (has no administration in time range)  HYDROcodone-acetaminophen (NORCO/VICODIN) 5-325 MG per tablet 1 tablet (1 tablet Oral Given 02/26/18 1355)     Initial Impression / Assessment and Plan / ED Course  I have reviewed the triage vital signs and the nursing notes.  Pertinent labs & imaging results that were available during my care of the patient were reviewed by me and considered in my medical decision making (see chart for details).     CT of the hip did not show any fractures.  Mild degenerative changes.  Urinalysis negative chemistries show mild dehydration.  Patient is given some IV fluids and will follow up with her orthopedic surgeon for her hip and her ankle.  She had no fever here in the emergency department  Final Clinical Impressions(s) / ED Diagnoses   Final diagnoses:  Dehydration    ED Discharge Orders    None       Milton Ferguson, MD 02/26/18 [phone]

## 2018-02-26 NOTE — ED Notes (Signed)
IV ATTEMPT WITH [place] NOT SUCCESSFUL. IV CONSULT REQUESTED

## 2018-02-26 NOTE — Telephone Encounter (Signed)
Called ER advised patient being transported from our office via EMS for low grade temp, chills, shaking, and not feeling well. Patient seen in our office today for post op visit. Advised that Dr August Saucer wanted CT scan of her hip done while patient was being evaluated.

## 2018-02-26 NOTE — Discharge Instructions (Addendum)
Follow-up with your orthopedic surgeon as planned

## 2018-02-26 NOTE — ED Notes (Addendum)
ATTEMPTS X 3 MADE TO CONTACT DAUGHTER RHONDA. UNABLE TO REACH DAUGHTER. MESSAGE LEFT ONLY TO CALL HOSPITAL. DAUGHTER AWARE PT IS HERE FROM EARLIER CONVERSATION.

## 2018-02-26 NOTE — Progress Notes (Addendum)
CSW spoke to the EDP who states pt is from [facility] and EDP is unable to call facility to explain pt cannot remain due to elevated BP which has declined since facility stated pt cannot return.  CSW called Nikki in admission who called facility head of nursing who stated pt can return but Lowella Bandy could not get through to pt's RN to advise her pt is returning.  Lowella Bandy stated she would drive to facility, advise the RN then call the CSW back to state pt can now return.  EDP updated.  The room number will be 105.  The number for report [phone] and CSW will update RN once [facility] calls and states pt can return.  8:53 PM CSW spoke to [place] who stated pt can be D/C'd back to [facility].  CSW will update RN.  8:54 PM RN updated through the ED Admin.  Please reconsult if future social work needs arise.  CSW signing off, as social work intervention is no longer needed.  Dorothe Pea. Anecia Nusbaum, LCSW, LCAS, CSI Clinical Social Worker Ph: [phone]

## 2018-02-26 NOTE — ED Notes (Signed)
URINE CULTURE COLLECTED 

## 2018-02-26 NOTE — ED Notes (Signed)
[facility] transport arranged. Pt given sandwich and ginger ale.

## 2018-02-26 NOTE — Telephone Encounter (Signed)
Per patients request called her daughter in law and advised that she was being transported via EMS to hospital for further eval due to her condition at post op visit. Bjorn Loser her daughter in law did not answer, I left her a detailed VM

## 2018-02-26 NOTE — ED Triage Notes (Signed)
Pt BIBA from [facility]. Pt had ankle fx on Friday and had surgery at [facility], being d/c'd 2 days ago. Pt is now complaining of chills and staff was concerned for low grade fever of 99.1.

## 2018-02-26 NOTE — ED Notes (Signed)
ED Provider at bedside. [place] 

## 2018-02-26 NOTE — ED Notes (Signed)
Writer talked with pt's daughter Bjorn Loser. Phone call was transferred into room and pt was able to talk with daughter. Attempted to contact facility to send pt back.

## 2018-02-26 NOTE — ED Notes (Signed)
Bed: ZH08 Expected date:  Expected time:  Means of arrival:  Comments: EMS fever/chills

## 2018-02-26 NOTE — ED Notes (Signed)
PT REFUSED IN AND OUT CATH. PT REQUEST PURWICK. PURWICK PLACED.

## 2018-02-26 NOTE — ED Notes (Signed)
Patient transported to CT 

## 2018-02-26 NOTE — ED Provider Notes (Addendum)
I was asked to see patient by nurse. BP has been rising, has not taken PM meds (knows 1 is clonidine but doesn't know others). Oral temp 99.4. Rectal temp 99.3.   Discussed with Dr. Ophelia Charter.  At this point, with no fever, normal WBC, will discharge home to follow-up with Dr. August Saucer.  Unclear exact source of the joint effusion but is probably reactive from the fall that occurred to cause her ankle fracture.  There is a little bit of soft tissue swelling to the left hip.  I doubt this is a septic hip.  Discharged home to follow-up with her PCP and orthopedics.   Pricilla Loveless, MD 02/26/18 Harrietta Guardian    Pricilla Loveless, MD 02/26/18 1831  BP remains high. Patient continues to have no complaints such as HA, CP, weakness. Stable for discharge.    Pricilla Loveless, MD 02/26/18 [phone]

## 2018-02-26 NOTE — Progress Notes (Signed)
Post-Op Visit Note   Patient: Ashley Mora           Date of Birth: June 18, 1933           MRN: [id number] Visit Date: 02/26/2018 PCP: Ashley Mane, MD   Assessment & Plan:  Chief Complaint:  Chief Complaint  Patient presents with  . Left Ankle - Routine Post Op   Visit Diagnoses:  1. Open ankle fracture, left, type I or II, initial encounter   2. Chronic pain of both knees   3. Pain in left hip     Plan: [name] is a patient underwent left leg external fixation removal with open reduction internal fixation of open bimalleolar ankle fracture.  She was discharged from the hospital 2 days ago.  States she is not feeling very well.  She is also having some hip pain.  Temperature is 99.1.  Blood pressures 154/79.  The skin appears intact in the region of the foot and ankle where the incision is.  Sutures not yet ready to be removed.  She did have a wound VAC on the incisions for 6 days.  She is having some chills by appearance.  Radiographs of the hip do not show any clear-cut left hip fracture.  She is having some hip pain both on the side as well as with some internal and external rotation of that leg.  She also is reporting bilateral knee pain and has arthritis in both of her knees.  Plan at this time is to send her to the ER for CT scan of the left hip to make sure she does not have some type of nondisplaced hip fracture.  Some symptoms consistent with possible early infection.  Again I do not see an infection source associated with that open fracture but her skin is very thin and that could be a problem down the road.  Follow-Up Instructions: Return in about 1 week (around 03/05/2018).   Orders:  Orders Placed This Encounter  Procedures  . XR Ankle Complete Left  . XR Pelvis 1-2 Views  . XR Knee 1-2 Views Left  . XR Knee 1-2 Views Right   No orders of the defined types were placed in this encounter.   Imaging: Ct Hip Left Wo Contrast  Result Date: 02/26/2018 CLINICAL DATA:   Fall left week with ankle fracture and surgery. Low-grade fever. Persistent hip pain with reported abnormal outside radiograph. EXAM: CT OF THE LEFT HIP WITHOUT CONTRAST TECHNIQUE: Multidetector CT imaging of the left hip was performed according to the standard protocol. Multiplanar CT image reconstructions were also generated. COMPARISON:  One-view pelvis 02/26/2018.  Pelvic CT 07/28/2017. FINDINGS: Bones/Joint/Cartilage Examination is limited to the inferior left hemipelvis and left hip. The bones appear demineralized. No evidence of acute fracture or dislocation. There is mild spurring of the greater trochanter. There are mild degenerative changes at the symphysis pubis and left sacroiliac joint. There is a small left hip joint effusion. Ligaments Suboptimally assessed by CT. Muscles and Tendons Mild gluteus muscular atrophy.  No tendon abnormality identified. Soft tissues No periarticular hematoma or fluid collection. There is minimal soft tissue stranding in the subcutaneous fat lateral to the left hip. Iliofemoral atherosclerosis and mild distal colonic diverticulosis noted. IMPRESSION: 1. No evidence of acute left hip fracture or dislocation. 2. Small left hip joint effusion. Mild degenerative changes at the left sacroiliac joint and symphysis pubis. Electronically Signed   By: Carey Bullocks M.D.   On: 02/26/2018 12:02   Xr  Ankle Complete Left  Result Date: 02/26/2018 AP lateral mortise left ankle reviewed.  Hardware in good position alignment with no complicating features.  Mortise is symmetric  Xr Knee 1-2 Views Left  Result Date: 02/26/2018 AP lateral left knee reviewed.  End-stage tricompartmental arthritis is present worse in the medial compartment.  Spurring and joint space narrowing is visible no fracture present  Xr Knee 1-2 Views Right  Result Date: 02/26/2018 AP lateral right knee reviewed.  Tricompartmental degenerative changes present.  This is worse in the medial compartment.  No  fracture present.  Xr Pelvis 1-2 Views  Result Date: 02/26/2018 AP pelvis reviewed.  Irregularity around the trochanteric tip.  Pelvic ring intact.  Right hip normal.  Further imaging with CT scanning could be performed to completely rule out intertrochanteric bone pathology   PMFS History: Patient Active Problem List   Diagnosis Date Noted  . Ankle fracture 02/20/2018  . Open ankle fracture, left, type I or II, initial encounter 02/18/2018  . COPD (chronic obstructive pulmonary disease) (H[facility]) 02/18/2018  . Borderline diabetes mellitus 02/18/2018  . Essential hypertension 02/18/2018  . Pasteurella cellulitis due to cat bite 02/18/2018  . Renal failure 02/18/2018  . Tibial fracture 02/18/2018   Past Medical History:  Diagnosis Date  . B12 deficiency   . Borderline diabetes   . COPD (chronic obstructive pulmonary disease) ([facility])    3L home O2  . HTN (hypertension)     Family History  Problem Relation Age of Onset  . Diabetes Mother     Past Surgical History:  Procedure Laterality Date  . ANKLE FRACTURE SURGERY Right   . APPLICATION OF WOUND VAC Left 02/18/2018   Procedure: APPLICATION OF WOUND VAC;  Surgeon: Cammy Copa, MD;  Location: [facility] OR;  Service: Orthopedics;  Laterality: Left;  . EXTERNAL FIXATION LEG Left 02/18/2018   Procedure: EXTERNAL FIXATION LEG;  Surgeon: Cammy Copa, MD;  Location: [facility] OR;  Service: Orthopedics;  Laterality: Left;  . EXTERNAL FIXATION REMOVAL Left 02/20/2018   Procedure: REMOVAL EXTERNAL FIXATION LEG;  Surgeon: Cammy Copa, MD;  Location: [facility] OR;  Service: Orthopedics;  Laterality: Left;  . I&D EXTREMITY Left 02/18/2018   Procedure: IRRIGATION AND DEBRIDEMENT EXTREMITY;  Surgeon: Cammy Copa, MD;  Location: [facility] OR;  Service: Orthopedics;  Laterality: Left;  . I&D EXTREMITY Left 02/20/2018   Procedure: IRRIGATION AND DEBRIDEMENT LEG;  Surgeon: Cammy Copa, MD;  Location: [facility] OR;  Service: Orthopedics;  Laterality:  Left;  . INCISION AND DRAINAGE OF WOUND Right 02/18/2018   Procedure: IRRIGATION AND DEBRIDEMENT WOUND;  Surgeon: Cammy Copa, MD;  Location: [facility] OR;  Service: Orthopedics;  Laterality: Right;  . ORIF ANKLE FRACTURE Left 02/20/2018   Procedure: OPEN REDUCTION INTERNAL FIXATION (ORIF) ANKLE FRACTURE;  Surgeon: Cammy Copa, MD;  Location: [facility] OR;  Service: Orthopedics;  Laterality: Left;   Social History   Occupational History  . Not on file  Tobacco Use  . Smoking status: Former Smoker    Packs/day: 1.00    Years: 50.00    Pack years: 50.00    Last attempt to quit: 2000    Years since quitting: 19.6  . Smokeless tobacco: Never Used  Substance and Sexual Activity  . Alcohol use: Never    Frequency: Never  . Drug use: Never  . Sexual activity: Not on file

## 2018-02-26 NOTE — ED Notes (Signed)
Facility is concerned about pt's BP. Provider aware.

## 2018-02-26 NOTE — ED Notes (Signed)
Please have patient call Daughter (Rhonda)[phone].

## 2018-02-26 NOTE — Progress Notes (Signed)
:  Location:  [facility] Room Number: 419Q Place of Service:  SNF (31)  Klaus Casteneda D. Sheppard Coil, MD  Patient Care Team: Beckie Salts, MD as PCP - General (Internal Medicine)  Extended Emergency Contact Information Primary Emergency Contact: [place] Phone: [phone] Mobile Phone: [phone] Relation: Son Secondary Emergency Contact: [place], [place] Mobile Phone: [phone] Relation: Relative     Allergies: Atorvastatin; Colchicine; and Tape  Chief Complaint  Patient presents with  . New Admit To SNF    Admit to [facility]    HPI: Patient is 84 y.o. female OPD on 3 L O2 at home, prediabetes, hypertension, who presented with an open ankle fracture/dislocation.  She was going into her house and tripped over the threshold.  She landed on the steps.  She had a similar fracture on the right about 5 years ago.  She also had a cat bite about a week ago which started swelling 3 to 4 days ago.  She did see her doctor today about this and was given tetanus shot and antibiotics.  Patient was admitted to [facility] from 8/21- 9/3 where she was treated for a left open ankle fracture which was reduced in the ER and then surgically fixed that same day and a right and hand and arm cellulitis which was treated initially with Unasyn then switched over to Augmentin with a stop date of 09/29/1854 course was complicated by acute renal failure for which she treated IV fluids and had her ACE held.  Patient is admitted to skilled nursing facility for OT/PT.  At skilled nursing facility patient will be followed for hypertension treated with Norvasc Benzapril Lasix and metoprolol, not well controlled at this time, GERD treated with omeprazole and B12 deficiency treated with injections.  Past Medical History:  Diagnosis Date  . B12 deficiency   . Borderline diabetes   . COPD (chronic obstructive pulmonary disease) ([place])    3L home O2  . HTN (hypertension)      Past Surgical History:  Procedure Laterality Date  . ANKLE FRACTURE SURGERY Right   . APPLICATION OF WOUND VAC Left 02/18/2018   Procedure: APPLICATION OF WOUND VAC;  Surgeon: Meredith Pel, MD;  Location: [place];  Service: Orthopedics;  Laterality: Left;  . EXTERNAL FIXATION LEG Left 02/18/2018   Procedure: EXTERNAL FIXATION LEG;  Surgeon: Meredith Pel, MD;  Location: [place];  Service: Orthopedics;  Laterality: Left;  . EXTERNAL FIXATION REMOVAL Left 02/20/2018   Procedure: REMOVAL EXTERNAL FIXATION LEG;  Surgeon: Meredith Pel, MD;  Location: [place];  Service: Orthopedics;  Laterality: Left;  . I&D EXTREMITY Left 02/18/2018   Procedure: IRRIGATION AND DEBRIDEMENT EXTREMITY;  Surgeon: Meredith Pel, MD;  Location: [place];  Service: Orthopedics;  Laterality: Left;  . I&D EXTREMITY Left 02/20/2018   Procedure: IRRIGATION AND DEBRIDEMENT LEG;  Surgeon: Meredith Pel, MD;  Location: [place];  Service: Orthopedics;  Laterality: Left;  . INCISION AND DRAINAGE OF WOUND Right 02/18/2018   Procedure: IRRIGATION AND DEBRIDEMENT WOUND;  Surgeon: Meredith Pel, MD;  Location: [place];  Service: Orthopedics;  Laterality: Right;  . ORIF ANKLE FRACTURE Left 02/20/2018   Procedure: OPEN REDUCTION INTERNAL FIXATION (ORIF) ANKLE FRACTURE;  Surgeon: Meredith Pel, MD;  Location: [employer];  Service: Orthopedics;  Laterality: Left;    Allergies as of 02/26/2018      Reactions   Atorvastatin Other (See Comments)   Made arms very sore  Colchicine Nausea Only   Tape Other (See Comments)   Patient bruises VERY easily and skin is FRAGILE!!      Medication List        Accurate as of 02/26/18  1:52 PM. Always use your most recent med list.          amLODipine 10 MG tablet Commonly known as:  NORVASC Take 10 mg by mouth daily.   aspirin EC 81 MG tablet Take 81 mg by mouth daily.   B-12 COMPLIANCE INJECTION 1000 MCG/ML Kit Generic drug:  Cyanocobalamin Inject 1 mL as  directed every 30 (thirty) days.   benazepril 10 MG tablet Commonly known as:  LOTENSIN Take 1 tablet (10 mg total) by mouth daily.   furosemide 20 MG tablet Commonly known as:  LASIX Take 20-40 mg by mouth See admin instructions. Take 40 mg by mouth in the morning and 20 mg in the evening   hydrALAZINE 50 MG tablet Commonly known as:  APRESOLINE Take 1 tablet (50 mg total) by mouth 3 (three) times daily.   metoprolol succinate 50 MG 24 hr tablet Commonly known as:  TOPROL-XL Take 50 mg by mouth daily. Take with or immediately following a meal.   NORCO 5-325 MG tablet Generic drug:  HYDROcodone-acetaminophen Take 1 tablet by mouth every 6 (six) hours as needed for moderate pain.   omeprazole 40 MG capsule Commonly known as:  PRILOSEC Take 40 mg by mouth daily.   OXYGEN Inhale 3 L into the lungs continuous.   PROAIR HFA 108 (90 Base) MCG/ACT inhaler Generic drug:  albuterol Inhale 2 puffs into the lungs every 6 (six) hours as needed for wheezing or shortness of breath.   albuterol (2.5 MG/3ML) 0.083% nebulizer solution Commonly known as:  PROVENTIL Take 2.5 mg by nebulization every 6 (six) hours as needed for wheezing or shortness of breath.   rivaroxaban 10 MG Tabs tablet Commonly known as:  XARELTO Take 1 tablet (10 mg total) by mouth daily for 21 days.   TRELEGY ELLIPTA 100-62.5-25 MCG/INH Aepb Generic drug:  Fluticasone-Umeclidin-Vilant Inhale 1 puff into the lungs daily.       No orders of the defined types were placed in this encounter.    There is no immunization history on file for this patient.  Social History   Tobacco Use  . Smoking status: Former Smoker    Packs/day: 1.00    Years: 50.00    Pack years: 50.00    Last attempt to quit: 2000    Years since quitting: 19.6  . Smokeless tobacco: Never Used  Substance Use Topics  . Alcohol use: Never    Frequency: Never    Family history is   Family History  Problem Relation Age of Onset  .  Diabetes Mother       Review of Systems  DATA OBTAINED: from patient GENERAL:  no fevers, fatigue, appetite changes SKIN: No itching, or rash EYES: No eye pain, redness, discharge EARS: No earache, tinnitus, change in hearing NOSE: No congestion, drainage or bleeding  MOUTH/THROAT: No mouth or tooth pain, No sore throat RESPIRATORY: No cough, wheezing, SOB CARDIAC: No chest pain, palpitations, lower extremity edema  GI: No abdominal pain, No N/V/D or constipation, No heartburn or reflux  GU: No dysuria, frequency or urgency, or incontinence  MUSCULOSKELETAL: No unrelieved bone/joint pain NEUROLOGIC: No headache, dizziness or focal weakness PSYCHIATRIC: No c/o anxiety or sadness   Vitals:   02/26/18 1303  BP: (!) 131/51  Pulse:  65  Resp: 18  Temp: 98.3 F (36.8 C)  SpO2: 95%    SpO2 Readings from Last 1 Encounters:  02/26/18 96%   Body mass index is 35.67 kg/m.     Physical Exam  GENERAL APPEARANCE: Alert, conversant,  No acute distress.  SKIN: Tanned and arm with minimal erythema no swelling or heat HEAD: Normocephalic, atraumatic  EYES: Conjunctiva/lids clear. Pupils round, reactive. EOMs intact.  EARS: External exam WNL, canals clear. Hearing grossly normal.  NOSE: No deformity or discharge.  MOUTH/THROAT: Lips w/o lesions  RESPIRATORY: Breathing is even, unlabored. Lung sounds are clear   CARDIOVASCULAR: Heart RRR no murmurs, rubs or gallops. No peripheral edema.   GASTROINTESTINAL: Abdomen is soft, non-tender, not distended w/ normal bowel sounds. GENITOURINARY: Bladder non tender, not distended  MUSCULOSKELETAL: On left foot NEUROLOGIC:  Cranial nerves 2-12 grossly intact. Moves all extremities  PSYCHIATRIC: Mood and affect appropriate to situation, no behavioral issues  Patient Active Problem List   Diagnosis Date Noted  . Ankle fracture 02/20/2018  . Open ankle fracture, left, type I or II, initial encounter 02/18/2018  . COPD (chronic obstructive  pulmonary disease) ([place]) 02/18/2018  . Borderline diabetes mellitus 02/18/2018  . Essential hypertension 02/18/2018  . Pasteurella cellulitis due to cat bite 02/18/2018  . Renal failure 02/18/2018  . Tibial fracture 02/18/2018      Labs reviewed: Basic Metabolic Panel:    Component Value Date/Time   NA 144 02/23/2018 0337   K 4.5 02/23/2018 0337   CL 110 02/23/2018 0337   CO2 29 02/23/2018 0337   GLUCOSE 111 (H) 02/23/2018 0337   BUN 21 02/23/2018 0337   CREATININE 1.09 (H) 02/23/2018 0337   CALCIUM 8.4 (L) 02/23/2018 0337   GFRNONAA 46 (L) 02/23/2018 0337   GFRAA 53 (L) 02/23/2018 0337    Recent Labs    02/19/18 0818 02/20/18 0358 02/21/18 0453 02/23/18 0337  NA 142 143 144 144  K 4.5 4.7 4.5 4.5  CL 110 112* 111 110  CO2 '25 27 26 29  '$ GLUCOSE 223* 133* 123* 111*  BUN 35* 33* 27* 21  CREATININE 1.52* 1.42* 1.24* 1.09*  CALCIUM 8.3* 8.5* 8.5* 8.4*  MG 1.9  --   --   --    Liver Function Tests: No results for input(s): AST, ALT, ALKPHOS, BILITOT, PROT, ALBUMIN in the last 8760 hours. No results for input(s): LIPASE, AMYLASE in the last 8760 hours. No results for input(s): AMMONIA in the last 8760 hours. CBC: Recent Labs    02/18/18 1404  02/21/18 0453 02/22/18 0354 02/23/18 0337  WBC 26.1*   < > 8.7 5.9 6.0  NEUTROABS 22.7*  --   --   --   --   HGB 11.5*   < > 9.2* 8.2* 9.1*  HCT 36.0   < > 30.6* 26.8* 29.7*  MCV 94.0   < > 96.5 96.8 96.1  PLT 143*   < > 140* 147* 174   < > = values in this interval not displayed.   Lipid No results for input(s): CHOL, HDL, LDLCALC, TRIG in the last 8760 hours.  Cardiac Enzymes: No results for input(s): CKTOTAL, CKMB, CKMBINDEX, TROPONINI in the last 8760 hours. BNP: No results for input(s): BNP in the last 8760 hours. No results found for: [facility] Lab Results  Component Value Date   HGBA1C 5.8 (H) 02/18/2018   No results found for: TSH No results found for: VITAMINB12 No results found for: FOLATE No  results found for:  IRON, TIBC, FERRITIN  Imaging and Procedures obtained prior to SNF admission: Ct Hip Left Wo Contrast  Result Date: 02/26/2018 CLINICAL DATA:  Fall left week with ankle fracture and surgery. Low-grade fever. Persistent hip pain with reported abnormal outside radiograph. EXAM: CT OF THE LEFT HIP WITHOUT CONTRAST TECHNIQUE: Multidetector CT imaging of the left hip was performed according to the standard protocol. Multiplanar CT image reconstructions were also generated. COMPARISON:  One-view pelvis 02/26/2018.  Pelvic CT 07/28/2017. FINDINGS: Bones/Joint/Cartilage Examination is limited to the inferior left hemipelvis and left hip. The bones appear demineralized. No evidence of acute fracture or dislocation. There is mild spurring of the greater trochanter. There are mild degenerative changes at the symphysis pubis and left sacroiliac joint. There is a small left hip joint effusion. Ligaments Suboptimally assessed by CT. Muscles and Tendons Mild gluteus muscular atrophy.  No tendon abnormality identified. Soft tissues No periarticular hematoma or fluid collection. There is minimal soft tissue stranding in the subcutaneous fat lateral to the left hip. Iliofemoral atherosclerosis and mild distal colonic diverticulosis noted. IMPRESSION: 1. No evidence of acute left hip fracture or dislocation. 2. Small left hip joint effusion. Mild degenerative changes at the left sacroiliac joint and symphysis pubis. Electronically Signed   By: Richardean Sale M.D.   On: 02/26/2018 12:02     Not all labs, radiology exams or other studies done during hospitalization come through on my EPIC note; however they are reviewed by me.    Assessment and Plan  Left open ankle fracture was reduced in the emergency department; had external fixators placed on 8/29 and then underwent surgical correction on 8/31.;  Will use Xarelto for prophylaxis until patient is weightbearing and ambulating SNF -if OT/PT; continue  Xarelto 10 mg daily for 21 days as prophylaxis  Cellulitis left arm/hand-secondary to recent cat bite, treated with Unasyn and transitioned to Augmentin with a stop date of 02/26/2018.  Apparently sutures were placed which will need to come out in 7 days from discharge on 9/3 SNF -continue Augmentin 500 mg twice daily for 2 more days  Acute blood loss anemia- hemoglobin stable, no need for transfusion SNF-follow-up CBC discharge hemoglobin 9.1  Acute kidney injury- baseline unknown, ACE was held, patient was hydrated and creatinine at 1.09 discharge; Lasix to be resumed and ACE inhibitor dose cut in half SNF -continue Lasix 40 every morning and 20 nightly; follow-up BMP  Hypertension SNF -continue Norvasc 10 mg daily, Benzapril 10 mg daily, hydralazine 50 mg 3 times daily and Toprol-XL 50 mg daily  Chronic respiratory failure with hypoxia/COPD SNF -stable-continue home O2  3 L nasal cannula along with Trelegy Ellipta 100-62 point 5-25 1 puff into lungs daily, PRN albuterol nebs and PRN Pro Air MDI  GERD SNF -OMeprazole 40 mg daily  B12 deficiency SNF -continue B12 and injection 1000 mcg per mill, 1 mill q. 30 days    Time spent greater than 45 minutes;> 50% of time with patient was spent reviewing records, labs, tests and studies, counseling and developing plan of care  Webb Silversmith D. Sheppard Coil, MD

## 2018-02-27 NOTE — ED Notes (Signed)
Pt discharged back to facility. Pt transported via [facility]. Paperwork sent with pt. Facility called regarding discharge.

## 2018-03-01 ENCOUNTER — Telehealth (INDEPENDENT_AMBULATORY_CARE_PROVIDER_SITE_OTHER): Payer: Self-pay | Admitting: Orthopedic Surgery

## 2018-03-01 NOTE — Telephone Encounter (Signed)
Please advise. Thanks.  

## 2018-03-01 NOTE — Telephone Encounter (Signed)
Patients daughter in law Ashley Mora would like to speak with you about patient, she just needs some direction and what to do for patients left ankle. How long until can she put pressure on it? Ashley Mora is also concerned with the way she is being treated at [facility]. Please call # [phone]

## 2018-03-01 NOTE — Telephone Encounter (Signed)
I called.  Patient had negative CT scan in the hospital.  Plan is to see her back a week from today.  Please call [facility] and have them bring her back a week from today thanks

## 2018-03-02 ENCOUNTER — Encounter: Payer: Self-pay | Admitting: Internal Medicine

## 2018-03-02 DIAGNOSIS — D62 Acute posthemorrhagic anemia: Secondary | ICD-10-CM | POA: Insufficient documentation

## 2018-03-02 DIAGNOSIS — K219 Gastro-esophageal reflux disease without esophagitis: Secondary | ICD-10-CM | POA: Insufficient documentation

## 2018-03-02 DIAGNOSIS — J9611 Chronic respiratory failure with hypoxia: Secondary | ICD-10-CM | POA: Insufficient documentation

## 2018-03-02 DIAGNOSIS — E538 Deficiency of other specified B group vitamins: Secondary | ICD-10-CM | POA: Insufficient documentation

## 2018-03-02 DIAGNOSIS — N179 Acute kidney failure, unspecified: Secondary | ICD-10-CM | POA: Insufficient documentation

## 2018-03-02 NOTE — Telephone Encounter (Signed)
Patient already has appt scheduled for tomorrow per nursing facility they already had transportation arranged.

## 2018-03-03 ENCOUNTER — Ambulatory Visit (INDEPENDENT_AMBULATORY_CARE_PROVIDER_SITE_OTHER): Payer: Medicare Other | Admitting: Orthopedic Surgery

## 2018-03-03 ENCOUNTER — Encounter (INDEPENDENT_AMBULATORY_CARE_PROVIDER_SITE_OTHER): Payer: Self-pay | Admitting: Orthopedic Surgery

## 2018-03-03 DIAGNOSIS — S82892B Other fracture of left lower leg, initial encounter for open fracture type I or II: Secondary | ICD-10-CM

## 2018-03-03 NOTE — Progress Notes (Signed)
Post-Op Visit Note   Patient: Ashley Mora           Date of Birth: 26-Sep-1932           MRN: [id number] Visit Date: 03/03/2018 PCP: [name], MD   Assessment & Plan:  Chief Complaint:  Chief Complaint  Patient presents with  . Left Hip - Follow-up  . Left Ankle - Follow-up   Visit Diagnoses:  1. Open ankle fracture, left, type I or II, initial encounter     Plan: [name] is now about 11 days out left leg removal of external fixator and open treatment of ankle fracture.  She was in the hospital where CAT scan was negative for left hip fracture.  No source of fever and malaise was found.  On examination today the incisions are intact but the sutures are not yet quite ready for removal.  On the medial open incision there is about a 1 cm x 3 cm area of skin which looks like it is having trouble healing.  Working to continue with dry dressing ankle range of motion exercises and nonweightbearing for now.  Come back in 10 days for repeat x-rays and suture removal.  We may have to assess best course of action if the soft tissues distally do not heal.  May do this in consultation with Dr. Lajoyce Corners.  Follow-Up Instructions: Return in about 10 days (around 03/13/2018).   Orders:  No orders of the defined types were placed in this encounter.  No orders of the defined types were placed in this encounter.   Imaging: No results found.  PMFS History: Patient Active Problem List   Diagnosis Date Noted  . Acute blood loss as cause of postoperative anemia 03/02/2018  . Acute kidney injury (HCC) 03/02/2018  . Chronic respiratory failure with hypoxia (HCC) 03/02/2018  . GERD (gastroesophageal reflux disease) 03/02/2018  . B12 deficiency 03/02/2018  . Ankle fracture 02/20/2018  . Open ankle fracture, left, type I or II, with routine healing, subsequent encounter 02/18/2018  . COPD (chronic obstructive pulmonary disease) (HCC) 02/18/2018  . Borderline diabetes mellitus 02/18/2018  .  Essential hypertension 02/18/2018  . Pasteurella cellulitis due to cat bite 02/18/2018  . Renal failure 02/18/2018  . Tibial fracture 02/18/2018   Past Medical History:  Diagnosis Date  . B12 deficiency   . Borderline diabetes   . COPD (chronic obstructive pulmonary disease) (HCC)    3L home O2  . HTN (hypertension)     Family History  Problem Relation Age of Onset  . Diabetes Mother     Past Surgical History:  Procedure Laterality Date  . ANKLE FRACTURE SURGERY Right   . APPLICATION OF WOUND VAC Left 02/18/2018   Procedure: APPLICATION OF WOUND VAC;  Surgeon: Cammy Copa, MD;  Location: [facility] OR;  Service: Orthopedics;  Laterality: Left;  . EXTERNAL FIXATION LEG Left 02/18/2018   Procedure: EXTERNAL FIXATION LEG;  Surgeon: Cammy Copa, MD;  Location[facility] OR;  Service: Orthopedics;  Laterality: Left;  . EXTERNAL FIXATION REMOVAL Left 02/20/2018   Procedure: REMOVAL EXTERNAL FIXATION LEG;  Surgeon: Cammy Copa, MD;  Location: [facility] OR;  Service: Orthopedics;  Laterality: Left;  . I&D EXTREMITY Left 02/18/2018   Procedure: IRRIGATION AND DEBRIDEMENT EXTREMITY;  Surgeon: Cammy Copa, MD;  Location: [facility] OR;  Service: Orthopedics;  Laterality: Left;  . I&D EXTREMITY Left 02/20/2018   Procedure: IRRIGATION AND DEBRIDEMENT LEG;  Surgeon: Cammy Copa, MD;  Location: [facility] OR;  Service: Orthopedics;  Laterality: Left;  . INCISION AND DRAINAGE OF WOUND Right 02/18/2018   Procedure: IRRIGATION AND DEBRIDEMENT WOUND;  Surgeon: Cammy Copa, MD;  Location: [facility] OR;  Service: Orthopedics;  Laterality: Right;  . ORIF ANKLE FRACTURE Left 02/20/2018   Procedure: OPEN REDUCTION INTERNAL FIXATION (ORIF) ANKLE FRACTURE;  Surgeon: Cammy Copa, MD;  Location[facility] OR;  Service: Orthopedics;  Laterality: Left;   Social History   Occupational History  . Not on file  Tobacco Use  . Smoking status: Former Smoker    Packs/day: 1.00    Years: 50.00    Pack years: 50.00     Last attempt to quit: 2000    Years since quitting: 19.7  . Smokeless tobacco: Never Used  Substance and Sexual Activity  . Alcohol use: Never    Frequency: Never  . Drug use: Never  . Sexual activity: Not on file

## 2018-03-08 LAB — BASIC METABOLIC PANEL
BUN: 39 — AB (ref 4–21)
CREATININE: 1.2 — AB (ref 0.5–1.1)
Glucose: 116
POTASSIUM: 3.6 (ref 3.4–5.3)
SODIUM: 145 (ref 137–147)

## 2018-03-15 ENCOUNTER — Ambulatory Visit (INDEPENDENT_AMBULATORY_CARE_PROVIDER_SITE_OTHER): Payer: Medicare Other | Admitting: Orthopedic Surgery

## 2018-03-15 ENCOUNTER — Telehealth (INDEPENDENT_AMBULATORY_CARE_PROVIDER_SITE_OTHER): Payer: Self-pay | Admitting: Orthopedic Surgery

## 2018-03-15 ENCOUNTER — Ambulatory Visit (INDEPENDENT_AMBULATORY_CARE_PROVIDER_SITE_OTHER): Payer: Medicare Other

## 2018-03-15 ENCOUNTER — Encounter (INDEPENDENT_AMBULATORY_CARE_PROVIDER_SITE_OTHER): Payer: Self-pay | Admitting: Orthopedic Surgery

## 2018-03-15 DIAGNOSIS — S82892B Other fracture of left lower leg, initial encounter for open fracture type I or II: Secondary | ICD-10-CM | POA: Diagnosis not present

## 2018-03-15 NOTE — Progress Notes (Signed)
Office Visit Note   Patient: Ashley Mora           Date of Birth: 12-Apr-1933           MRN: [id number] Visit Date: 03/15/2018              Requested by: Patria ManeGassemi, Mike, MD No address on file PCP: Patria ManeGassemi, Mike, MD  Chief Complaint  Patient presents with  . Left Ankle - Follow-up, Routine Post Op      HPI: Patient status post open reduction internal fixation for bimalleolar left ankle fracture open with large medial laceration.  Patient has underlying venous insufficiency.  Assessment & Plan: Visit Diagnoses:  1. Open ankle fracture, left, type I or II, initial encounter     Plan: Patient will start wearing the black medical compression stocking she will wear this around the clock directly against the wound she is 50% weightbearing elevation compression and using the calf pump to decrease swelling.  Follow-Up Instructions: No follow-ups on file.   Ortho Exam  Patient is alert, oriented, no adenopathy, well-dressed, normal affect, normal respiratory effort. Examination patient has venous insufficiency she has a good pulse she has ischemic changes to the traumatic wound there is no signs or symptoms of infection the bone is healing well internal fixation is in place.  Imaging: No results found.   Labs: Lab Results  Component Value Date   HGBA1C 5.8 (H) 02/18/2018     No results found for: ALBUMIN, PREALBUMIN, LABURIC  There is no height or weight on file to calculate BMI.  Orders:  Orders Placed This Encounter  Procedures  . XR Ankle Complete Left   No orders of the defined types were placed in this encounter.    Procedures: No procedures performed  Clinical Data: No additional findings.  ROS:  All other systems negative, except as noted in the HPI. Review of Systems  Objective: Vital Signs: There were no vitals taken for this visit.  Specialty Comments:  No specialty comments available.  PMFS History: Patient Active Problem List   Diagnosis Date Noted  . Acute blood loss as cause of postoperative anemia 03/02/2018  . Acute kidney injury (HCC) 03/02/2018  . Chronic respiratory failure with hypoxia (HCC) 03/02/2018  . GERD (gastroesophageal reflux disease) 03/02/2018  . B12 deficiency 03/02/2018  . Ankle fracture 02/20/2018  . Open ankle fracture, left, type I or II, with routine healing, subsequent encounter 02/18/2018  . COPD (chronic obstructive pulmonary disease) (HCC) 02/18/2018  . Borderline diabetes mellitus 02/18/2018  . Essential hypertension 02/18/2018  . Pasteurella cellulitis due to cat bite 02/18/2018  . Renal failure 02/18/2018  . Tibial fracture 02/18/2018   Past Medical History:  Diagnosis Date  . B12 deficiency   . Borderline diabetes   . COPD (chronic obstructive pulmonary disease) (HCC)    3L home O2  . HTN (hypertension)     Family History  Problem Relation Age of Onset  . Diabetes Mother     Past Surgical History:  Procedure Laterality Date  . ANKLE FRACTURE SURGERY Right   . APPLICATION OF WOUND VAC Left 02/18/2018   Procedure: APPLICATION OF WOUND VAC;  Surgeon: Cammy Copaean, Gregory Scott, MD;  [facility];  Service: Orthopedics;  Laterality: Left;  . EXTERNAL FIXATION LEG Left 02/18/2018   Procedure: EXTERNAL FIXATION LEG;  Surgeon: Cammy Copaean, Gregory Scott, MD;  [facility];  Service: Orthopedics;  Laterality: Left;  . EXTERNAL FIXATION REMOVAL Left 02/20/2018   Procedure: REMOVAL EXTERNAL FIXATION  LEG;  Surgeon: Cammy Copa, MD;  Location: [facility] OR;  Service: Orthopedics;  Laterality: Left;  . I&D EXTREMITY Left 02/18/2018   Procedure: IRRIGATION AND DEBRIDEMENT EXTREMITY;  Surgeon: Cammy Copa, MD;  Location: [facility] OR;  Service: Orthopedics;  Laterality: Left;  . I&D EXTREMITY Left 02/20/2018   Procedure: IRRIGATION AND DEBRIDEMENT LEG;  Surgeon: Cammy Copa, MD;  Location: [facility] OR;  Service: Orthopedics;  Laterality: Left;  . INCISION AND DRAINAGE OF WOUND Right  02/18/2018   Procedure: IRRIGATION AND DEBRIDEMENT WOUND;  Surgeon: Cammy Copa, MD;  Location: [facility] OR;  Service: Orthopedics;  Laterality: Right;  . ORIF ANKLE FRACTURE Left 02/20/2018   Procedure: OPEN REDUCTION INTERNAL FIXATION (ORIF) ANKLE FRACTURE;  Surgeon: Cammy Copa, MD;  Location: [facility] OR;  Service: Orthopedics;  Laterality: Left;   Social History   Occupational History  . Not on file  Tobacco Use  . Smoking status: Former Smoker    Packs/day: 1.00    Years: 50.00    Pack years: 50.00    Last attempt to quit: 2000    Years since quitting: 19.7  . Smokeless tobacco: Never Used  Substance and Sexual Activity  . Alcohol use: Never    Frequency: Never  . Drug use: Never  . Sexual activity: Not on file

## 2018-03-15 NOTE — Telephone Encounter (Signed)
Patients daughter Pamelia HoitRonda called stated that she needed to get Home Health service to come to the home after she is discharged from rehab on Wednesday.  Stated that ins co requesting referral for Home health Service.  Please call daughter Pamelia HoitRonda to advise [phone]

## 2018-03-16 ENCOUNTER — Encounter: Payer: Self-pay | Admitting: Internal Medicine

## 2018-03-16 ENCOUNTER — Non-Acute Institutional Stay (SKILLED_NURSING_FACILITY): Payer: Medicare Other | Admitting: Internal Medicine

## 2018-03-16 DIAGNOSIS — D62 Acute posthemorrhagic anemia: Secondary | ICD-10-CM | POA: Diagnosis not present

## 2018-03-16 DIAGNOSIS — S82302E Unspecified fracture of lower end of left tibia, subsequent encounter for open fracture type I or II with routine healing: Secondary | ICD-10-CM | POA: Diagnosis not present

## 2018-03-16 DIAGNOSIS — K219 Gastro-esophageal reflux disease without esophagitis: Secondary | ICD-10-CM

## 2018-03-16 DIAGNOSIS — W5501XA Bitten by cat, initial encounter: Secondary | ICD-10-CM

## 2018-03-16 DIAGNOSIS — S82892E Other fracture of left lower leg, subsequent encounter for open fracture type I or II with routine healing: Secondary | ICD-10-CM

## 2018-03-16 DIAGNOSIS — J449 Chronic obstructive pulmonary disease, unspecified: Secondary | ICD-10-CM

## 2018-03-16 DIAGNOSIS — J9611 Chronic respiratory failure with hypoxia: Secondary | ICD-10-CM

## 2018-03-16 DIAGNOSIS — R7303 Prediabetes: Secondary | ICD-10-CM

## 2018-03-16 DIAGNOSIS — A280 Pasteurellosis: Secondary | ICD-10-CM | POA: Diagnosis not present

## 2018-03-16 DIAGNOSIS — N183 Chronic kidney disease, stage 3 (moderate): Secondary | ICD-10-CM

## 2018-03-16 DIAGNOSIS — E538 Deficiency of other specified B group vitamins: Secondary | ICD-10-CM

## 2018-03-16 DIAGNOSIS — N179 Acute kidney failure, unspecified: Secondary | ICD-10-CM

## 2018-03-16 DIAGNOSIS — I10 Essential (primary) hypertension: Secondary | ICD-10-CM

## 2018-03-16 LAB — BASIC METABOLIC PANEL
BUN: 30 — AB (ref 4–21)
Creatinine: 1.2 — AB (ref 0.5–1.1)
GLUCOSE: 96
Potassium: 4.1 (ref 3.4–5.3)
Sodium: 143 (ref 137–147)

## 2018-03-16 NOTE — Progress Notes (Signed)
 Location:  [facility] Room Number: 102P Place of Service:  SNF ([id number])  Anne D. Alexander, MD  Patient Care Team: Gassemi, Mike, MD as PCP - General (Internal Medicine)  Extended Emergency Contact Information Primary Emergency Contact: Masih,Bobby Home Phone: [phone] Mobile Phone: [phone] Relation: Son Secondary Emergency Contact: Helderman, Rhonda Mobile Phone: [phone] Relation: Relative  Allergies  Allergen Reactions  . Atorvastatin Other (See Comments)    Made arms very sore  . Colchicine Nausea Only  . Tape Other (See Comments)    Patient bruises VERY easily and skin is FRAGILE!!    Chief Complaint  Patient presents with  . Discharge Note    Discharge from [facility]    HPI:  85 y.o. female with COPD on 3 L O2 at home, prediabetes, hypertension, who presented to the emergency department with an open ankle fracture/dislocation.  She was going into her house and tripped over the threshold and landed on the steps.  She had a similar fracture on the right about 5 years ago.  She also had a cat bite about a week ago which started swelling 3 to 4 days ago.  She did see her doctor today about this and was given a tetanus shot and antibiotics.  Patient was admitted to [facility] from 8/21-9/3 where she was treated for left open ankle fracture which was reduced in the ER then surgically fixed the same day.  She was also treated for a right arm and hand cellulitis which was treated initially with Unasyn then switched over to Augmentin with a stop date of 9/6.  Hospital course was comp gated by acute renal failure for which she was treated with IV fluids and had her ACE held.  Patient was admitted to skilled nursing facility for OT/PT and is now ready to be discharged home.    Past Medical History:  Diagnosis Date  . B12 deficiency   . Borderline diabetes   . COPD (chronic obstructive pulmonary disease) (HCC)    3L home O2  .  HTN (hypertension)     Past Surgical History:  Procedure Laterality Date  . ANKLE FRACTURE SURGERY Right   . APPLICATION OF WOUND VAC Left 02/18/2018   Procedure: APPLICATION OF WOUND VAC;  Surgeon: Dean, Gregory Scott, MD;  Location: [facility] OR;  Service: Orthopedics;  Laterality: Left;  . EXTERNAL FIXATION LEG Left 02/18/2018   Procedure: EXTERNAL FIXATION LEG;  Surgeon: Dean, Gregory Scott, MD;  Location: [facility] OR;  Service: Orthopedics;  Laterality: Left;  . EXTERNAL FIXATION REMOVAL Left 02/20/2018   Procedure: REMOVAL EXTERNAL FIXATION LEG;  Surgeon: Dean, Gregory Scott, MD;  Location: [facility] OR;  Service: Orthopedics;  Laterality: Left;  . I&D EXTREMITY Left 02/18/2018   Procedure: IRRIGATION AND DEBRIDEMENT EXTREMITY;  Surgeon: Dean, Gregory Scott, MD;  Location: [facility] OR;  Service: Orthopedics;  Laterality: Left;  . I&D EXTREMITY Left 02/20/2018   Procedure: IRRIGATION AND DEBRIDEMENT LEG;  Surgeon: Dean, Gregory Scott, MD;  Location: [facility] OR;  Service: Orthopedics;  Laterality: Left;  . INCISION AND DRAINAGE OF WOUND Right 02/18/2018   Procedure: IRRIGATION AND DEBRIDEMENT WOUND;  Surgeon: Dean, Gregory Scott, MD;  Location: [facility] OR;  Service: Orthopedics;  Laterality: Right;  . ORIF ANKLE FRACTURE Left 02/20/2018   Procedure: OPEN REDUCTION INTERNAL FIXATION (ORIF) ANKLE FRACTURE;  Surgeon: Dean, Gregory Scott, MD;  Location: [facility] OR;  Service: Orthopedics;  Laterality: Left;     reports that she quit smoking about 19   years ago. She has a 50.00 pack-year smoking history. She has never used smokeless tobacco. She reports that she does not drink alcohol or use drugs. Social History   Socioeconomic History  . Marital status: Widowed    Spouse name: Not on file  . Number of children: Not on file  . Years of education: Not on file  . Highest education level: Not on file  Occupational History  . Not on file  Social Needs  . Financial resource strain: Not on file  . Food insecurity:    Worry: Not on file     Inability: Not on file  . Transportation needs:    Medical: Not on file    Non-medical: Not on file  Tobacco Use  . Smoking status: Former Smoker    Packs/day: 1.00    Years: 50.00    Pack years: 50.00    Last attempt to quit: 2000    Years since quitting: 19.7  . Smokeless tobacco: Never Used  Substance and Sexual Activity  . Alcohol use: Never    Frequency: Never  . Drug use: Never  . Sexual activity: Not on file  Lifestyle  . Physical activity:    Days per week: Not on file    Minutes per session: Not on file  . Stress: Not on file  Relationships  . Social connections:    Talks on phone: Not on file    Gets together: Not on file    Attends religious service: Not on file    Active member of club or organization: Not on file    Attends meetings of clubs or organizations: Not on file    Relationship status: Not on file  . Intimate partner violence:    Fear of current or ex partner: Not on file    Emotionally abused: Not on file    Physically abused: Not on file    Forced sexual activity: Not on file  Other Topics Concern  . Not on file  Social History Narrative  . Not on file    Pertinent  Health Maintenance Due  Topic Date Due  . INFLUENZA VACCINE  01/21/2018  . DEXA SCAN  03/28/2018 (Originally 08/31/1999)  . PNA vac Low Risk Adult (1 of 2 - PCV13) 03/28/2018 (Originally 08/31/1999)    Medications: Allergies as of 03/16/2018      Reactions   Atorvastatin Other (See Comments)   Made arms very sore   Colchicine Nausea Only   Tape Other (See Comments)   Patient bruises VERY easily and skin is FRAGILE!!      Medication List        Accurate as of 03/16/18 11:59 PM. Always use your most recent med list.          amLODipine 10 MG tablet Commonly known as:  NORVASC Take 10 mg by mouth daily.   aspirin EC 81 MG tablet Take 81 mg by mouth daily.   B-12 COMPLIANCE INJECTION 1000 MCG/ML Kit Generic drug:  Cyanocobalamin Inject 1 mL as directed every 30  (thirty) days.   benazepril 10 MG tablet Commonly known as:  LOTENSIN Take 1 tablet (10 mg total) by mouth daily.   furosemide 20 MG tablet Commonly known as:  LASIX Take 40 mg by mouth daily. Take 40 mg every morning   hydrALAZINE 50 MG tablet Commonly known as:  APRESOLINE Take 1 tablet (50 mg total) by mouth 3 (three) times daily.   metoprolol succinate 50 MG 24 hr tablet  Commonly known as:  TOPROL-XL Take 50 mg by mouth daily. Take with or immediately following a meal.   NORCO 5-325 MG tablet Generic drug:  HYDROcodone-acetaminophen Take 1 tablet by mouth 2 (two) times daily.   omeprazole 40 MG capsule Commonly known as:  PRILOSEC Take 40 mg by mouth daily.   OXYGEN Inhale 3 L into the lungs continuous.   PROAIR HFA 108 (90 Base) MCG/ACT inhaler Generic drug:  albuterol Inhale 2 puffs into the lungs every 6 (six) hours as needed for wheezing or shortness of breath.   albuterol (2.5 MG/3ML) 0.083% nebulizer solution Commonly known as:  PROVENTIL Take 2.5 mg by nebulization every 6 (six) hours as needed for wheezing or shortness of breath.   TRELEGY ELLIPTA 100-62.5-25 MCG/INH Aepb Generic drug:  Fluticasone-Umeclidin-Vilant Inhale 1 puff into the lungs daily.        Vitals:   03/16/18 1540  BP: 122/70  Pulse: 67  Resp: 18  Temp: 97.9 F (36.6 C)  Weight: 195 lb (88.5 kg)  Height: 5' 2" (1.575 m)   Body mass index is 35.67 kg/m.  Physical Exam  GENERAL APPEARANCE: Alert, conversant. No acute distress.  HEENT: Unremarkable. RESPIRATORY: Breathing is even, unlabored. Lung sounds are clear   CARDIOVASCULAR: Heart RRR no murmurs, rubs or gallops. No peripheral edema.  GASTROINTESTINAL: Abdomen is soft, non-tender, not distended w/ normal bowel sounds.  NEUROLOGIC: Cranial nerves 2-12 grossly intact. Moves all extremities   Labs reviewed: Basic Metabolic Panel: Recent Labs    02/19/18 0818  02/21/18 0453 02/23/18 0337 02/26/18 1435 03/08/18  03/16/18  NA 142   < > 144 144 145 145 143  K 4.5   < > 4.5 4.5 4.0 3.6 4.1  CL 110   < > 111 110 104  --   --   CO2 25   < > _0 --   --   GLUCOSE 223*   < > 123* 111* 112*  --   --   BUN 35*   < > 27* 21 28* 39* 30*  CREATININE 1.52*   < > 1.24* 1.09* 1.14* 1.2* 1.2*  CALCIUM 8.3*   < > 8.5* 8.4* 9.5  --   --   MG 1.9  --   --   --   --   --   --    < > = values in this interval not displayed.   No results found for: [facility] Liver Function Tests: No results for input(s): AST, ALT, ALKPHOS, BILITOT, PROT, ALBUMIN in the last 8760 hours. No results for input(s): LIPASE, AMYLASE in the last 8760 hours. No results for input(s): AMMONIA in the last 8760 hours. CBC: Recent Labs    02/18/18 1404  02/22/18 0354 02/23/18 0337 02/26/18 1435  WBC 26.1*   < > 5.9 6.0 9.4  NEUTROABS 22.7*  --   --   --  7.5  HGB 11.5*   < > 8.2* 9.1* 11.4*  HCT 36.0   < > 26.8* 29.7* 34.7*  MCV 94.0   < > 96.8 96.1 91.8  PLT 143*   < > 147* 174 322   < > = values in this interval not displayed.   Lipid No results for input(s): CHOL, HDL, LDLCALC, TRIG in the last 8760 hours. Cardiac Enzymes: No results for input(s): CKTOTAL, CKMB, CKMBINDEX, TROPONINI in the last 8760 hours. BNP: No results for input(s): BNP in the last 8760 hours. CBG: Recent Labs    02/24/18  2107 02/25/18 0720 02/25/18 1151  GLUCAP 96 105* 110*    Procedures and Imaging Studies During Stay: Dg Tibia/fibula Left  Result Date: 02/18/2018 CLINICAL DATA:  External fixation of left lower leg. EXAM: LEFT TIBIA AND FIBULA - 2 VIEW; DG C-ARM 61-120 MIN COMPARISON:  02/18/2018 preoperative ankle radiographs. FINDINGS: Fine bony detail is limited by the C-arm fluoroscopic technique. A total of 16 seconds of fluoroscopic time was utilized for external fixation across an open fracture dislocation involving the left tibiotalar joint and malleoli. Improved anatomic alignment despite limited fine bony detail due to the C-arm  fluoroscopic technique. IMPRESSION: External fixation across the patient's known trimalleolar fracture dislocation of the ankle joint. Improved alignment with external fixation hardware in place. Electronically Signed   By: David  Kwon M.D.   On: 02/18/2018 21:20   Dg Ankle 2 Views Left  Result Date: 02/20/2018 CLINICAL DATA:  85-year-old female undergoing ORIF of bimalleolar fracture. EXAM: LEFT ANKLE - 2 VIEW; DG C-ARM 61-120 MIN COMPARISON:  Prior radiographs 02/18/2018 FINDINGS: Three intraoperative spot radiographs demonstrate removal of external fixation and ORIF of the bimalleolar fracture with application of a lateral plate and screw construct along the distal fibula, and placement of 2 cannulated lag screws complete with washers across the medial malleolar fracture. Irregularity of the soft tissues overlying the medial malleolus consistent with an open wound. IMPRESSION: ORIF of bimalleolar fracture as above. No evidence of immediate hardware complication. The previously noted external fixator has been removed. Electronically Signed   By: Heath  McCullough M.D.   On: 02/20/2018 11:53   Ct Hip Left Wo Contrast  Result Date: 02/26/2018 CLINICAL DATA:  Fall left week with ankle fracture and surgery. Low-grade fever. Persistent hip pain with reported abnormal outside radiograph. EXAM: CT OF THE LEFT HIP WITHOUT CONTRAST TECHNIQUE: Multidetector CT imaging of the left hip was performed according to the standard protocol. Multiplanar CT image reconstructions were also generated. COMPARISON:  One-view pelvis 02/26/2018.  Pelvic CT 07/28/2017. FINDINGS: Bones/Joint/Cartilage Examination is limited to the inferior left hemipelvis and left hip. The bones appear demineralized. No evidence of acute fracture or dislocation. There is mild spurring of the greater trochanter. There are mild degenerative changes at the symphysis pubis and left sacroiliac joint. There is a small left hip joint effusion. Ligaments  Suboptimally assessed by CT. Muscles and Tendons Mild gluteus muscular atrophy.  No tendon abnormality identified. Soft tissues No periarticular hematoma or fluid collection. There is minimal soft tissue stranding in the subcutaneous fat lateral to the left hip. Iliofemoral atherosclerosis and mild distal colonic diverticulosis noted. IMPRESSION: 1. No evidence of acute left hip fracture or dislocation. 2. Small left hip joint effusion. Mild degenerative changes at the left sacroiliac joint and symphysis pubis. Electronically Signed   By: William  Veazey M.D.   On: 02/26/2018 12:02   Dg Ankle Left Port  Result Date: 02/20/2018 CLINICAL DATA:  85-year-old female status post ORIF of bimalleolar fracture EXAM: PORTABLE LEFT ANKLE - 2 VIEW COMPARISON:  Intraoperative radiographs obtained earlier today FINDINGS: Interval surgical changes of ORIF of a distal fibular fracture with a lateral buttress plate and screw construct. No evidence of hardware complication. The medial malleolar fracture has been transfixed with 2 cannulated lag screws 1 of which passes through a washer. Alignment of the fracture fragments is significantly improved. A wound VAC is present as well as plaster splinting material. IMPRESSION: ORIF of a bimalleolar fracture without evidence of immediate hardware complication. Electronically Signed   By: Heath    McCullough M.D.   On: 02/20/2018 11:54   Dg C-arm 1-60 Min  Result Date: 02/20/2018 CLINICAL DATA:  85-year-old female undergoing ORIF of bimalleolar fracture. EXAM: LEFT ANKLE - 2 VIEW; DG C-ARM 61-120 MIN COMPARISON:  Prior radiographs 02/18/2018 FINDINGS: Three intraoperative spot radiographs demonstrate removal of external fixation and ORIF of the bimalleolar fracture with application of a lateral plate and screw construct along the distal fibula, and placement of 2 cannulated lag screws complete with washers across the medial malleolar fracture. Irregularity of the soft tissues overlying  the medial malleolus consistent with an open wound. IMPRESSION: ORIF of bimalleolar fracture as above. No evidence of immediate hardware complication. The previously noted external fixator has been removed. Electronically Signed   By: Heath  McCullough M.D.   On: 02/20/2018 11:53   Dg C-arm 1-60 Min  Result Date: 02/20/2018 CLINICAL DATA:  85-year-old female undergoing ORIF of bimalleolar fracture. EXAM: LEFT ANKLE - 2 VIEW; DG C-ARM 61-120 MIN COMPARISON:  Prior radiographs 02/18/2018 FINDINGS: Three intraoperative spot radiographs demonstrate removal of external fixation and ORIF of the bimalleolar fracture with application of a lateral plate and screw construct along the distal fibula, and placement of 2 cannulated lag screws complete with washers across the medial malleolar fracture. Irregularity of the soft tissues overlying the medial malleolus consistent with an open wound. IMPRESSION: ORIF of bimalleolar fracture as above. No evidence of immediate hardware complication. The previously noted external fixator has been removed. Electronically Signed   By: Heath  McCullough M.D.   On: 02/20/2018 11:53   Dg C-arm 1-60 Min  Result Date: 02/18/2018 CLINICAL DATA:  External fixation of left lower leg. EXAM: LEFT TIBIA AND FIBULA - 2 VIEW; DG C-ARM 61-120 MIN COMPARISON:  02/18/2018 preoperative ankle radiographs. FINDINGS: Fine bony detail is limited by the C-arm fluoroscopic technique. A total of 16 seconds of fluoroscopic time was utilized for external fixation across an open fracture dislocation involving the left tibiotalar joint and malleoli. Improved anatomic alignment despite limited fine bony detail due to the C-arm fluoroscopic technique. IMPRESSION: External fixation across the patient's known trimalleolar fracture dislocation of the ankle joint. Improved alignment with external fixation hardware in place. Electronically Signed   By: David  Kwon M.D.   On: 02/18/2018 21:20   Xr Ankle Complete  Left  Result Date: 03/16/2018 AP lateral mortise left ankle reviewed.  Good fracture alignment is present.  No evidence of hardware complication or loosening.  Bimalleolar ankle fracture fixation has occurred.  Xr Ankle Complete Left  Result Date: 02/26/2018 AP lateral mortise left ankle reviewed.  Hardware in good position alignment with no complicating features.  Mortise is symmetric  Xr Knee 1-2 Views Left  Result Date: 02/26/2018 AP lateral left knee reviewed.  End-stage tricompartmental arthritis is present worse in the medial compartment.  Spurring and joint space narrowing is visible no fracture present  Xr Knee 1-2 Views Right  Result Date: 02/26/2018 AP lateral right knee reviewed.  Tricompartmental degenerative changes present.  This is worse in the medial compartment.  No fracture present.  Xr Pelvis 1-2 Views  Result Date: 02/26/2018 AP pelvis reviewed.  Irregularity around the trochanteric tip.  Pelvic ring intact.  Right hip normal.  Further imaging with CT scanning could be performed to completely rule out intertrochanteric bone pathology   Assessment/Plan:   Open ankle fracture, left, type I or II, with routine healing, subsequent encounter  Type I or II open fracture of distal end of left tibia with routine healing, unspecified   fracture morphology, subsequent encounter  Type I or II open fracture of left ankle with routine healing, subsequent encounter  Pasteurella cellulitis due to cat bite  Acute blood loss as cause of postoperative anemia  Acute kidney injury (HCC)  Chronic respiratory failure with hypoxia (HCC)  Essential hypertension  Acute renal failure superimposed on stage 3 chronic kidney disease, unspecified acute renal failure type (HCC)  Borderline diabetes mellitus  Chronic obstructive pulmonary disease, unspecified COPD type (HCC)  Gastroesophageal reflux disease without esophagitis  B12 deficiency   Patient is being discharged with the  following home health services: OT/PT/nursing/aid  Patient is being discharged with the following durable medical equipment: None  Patient has been advised to f/u with their PCP in 1-2 weeks to bring them up to date on their rehab stay.  Social services at facility was responsible for arranging this appointment.  Pt was provided with a 30 day supply of prescriptions for medications and refills must be obtained from their PCP.  For controlled substances, a more limited supply may be provided adequate until PCP appointment only.  Medications have been reconciled.  Spent greater than 30 minutes;> 50% of time with patient was spent reviewing records, labs, tests and studies, counseling and developing plan of care  Anne D. Alexander, MD 

## 2018-03-16 NOTE — Progress Notes (Signed)
Post-Op Visit Note   Patient: Charleston RopesVirgina Staat           Date of Birth: 05-15-1933           MRN: [id number] Visit Date: 03/15/2018 PCP: Patria ManeGassemi, Mike, MD   Assessment & Plan:  Chief Complaint:  Chief Complaint  Patient presents with  . Left Ankle - Follow-up, Routine Post Op   Visit Diagnoses:  1. Open ankle fracture, left, type I or II, initial encounter     Plan: [place] a patient who underwent bimalleolar ankle fracture fixation for open fracture 02/20/2018.  She is doing well healing the fracture and she is been partial weightbearing on that left ankle in the fracture boot.  She is having some healing issues with the open area around the scan on the medial aspect of the ankle.  I asked Dr. Lajoyce Cornersuda to look at that today and he will assume care for that portion of her problem.  Sutures are removed today.  I am to let her do partial weightbearing in that fracture boot.  Okay to discontinue fracture boot when she is not walking.  Okay for ankle range of motion at that time.  Prescription for compression socks given.  Follow-Up Instructions: Return in about 2 weeks (around 03/29/2018).   Orders:  Orders Placed This Encounter  Procedures  . XR Ankle Complete Left   No orders of the defined types were placed in this encounter.   Imaging: No results found.  PMFS History: Patient Active Problem List   Diagnosis Date Noted  . Acute blood loss as cause of postoperative anemia 03/02/2018  . Acute kidney injury (HCC) 03/02/2018  . Chronic respiratory failure with hypoxia (HCC) 03/02/2018  . GERD (gastroesophageal reflux disease) 03/02/2018  . B12 deficiency 03/02/2018  . Ankle fracture 02/20/2018  . Open ankle fracture, left, type I or II, with routine healing, subsequent encounter 02/18/2018  . COPD (chronic obstructive pulmonary disease) (HCC) 02/18/2018  . Borderline diabetes mellitus 02/18/2018  . Essential hypertension 02/18/2018  . Pasteurella cellulitis due to cat bite  02/18/2018  . Renal failure 02/18/2018  . Tibial fracture 02/18/2018   Past Medical History:  Diagnosis Date  . B12 deficiency   . Borderline diabetes   . COPD (chronic obstructive pulmonary disease) (HCC)    3L home O2  . HTN (hypertension)     Family History  Problem Relation Age of Onset  . Diabetes Mother     Past Surgical History:  Procedure Laterality Date  . ANKLE FRACTURE SURGERY Right   . APPLICATION OF WOUND VAC Left 02/18/2018   Procedure: APPLICATION OF WOUND VAC;  Surgeon: Cammy Copaean, Han Vejar Scott, MD;  [facility];  Service: Orthopedics;  Laterality: Left;  . EXTERNAL FIXATION LEG Left 02/18/2018   Procedure: EXTERNAL FIXATION LEG;  Surgeon: Cammy Copaean, Thara Searing Scott, MD;  [facility];  Service: Orthopedics;  Laterality: Left;  . EXTERNAL FIXATION REMOVAL Left 02/20/2018   Procedure: REMOVAL EXTERNAL FIXATION LEG;  Surgeon: Cammy Copaean, Raiquan Chandler Scott, MD;  [facility];  Service: Orthopedics;  Laterality: Left;  . I&D EXTREMITY Left 02/18/2018   Procedure: IRRIGATION AND DEBRIDEMENT EXTREMITY;  Surgeon: Cammy Copaean, Posey Petrik Scott, MD;  [facility];  Service: Orthopedics;  Laterality: Left;  . I&D EXTREMITY Left 02/20/2018   Procedure: IRRIGATION AND DEBRIDEMENT LEG;  Surgeon: Cammy Copaean, Mataio Mele Scott, MD;  [facility];  Service: Orthopedics;  Laterality: Left;  . INCISION AND DRAINAGE OF WOUND Right 02/18/2018   Procedure: IRRIGATION AND DEBRIDEMENT  WOUND;  Surgeon: Cammy Copa, MD;  Location[facility];  Service: Orthopedics;  Laterality: Right;  . ORIF ANKLE FRACTURE Left 02/20/2018   Procedure: OPEN REDUCTION INTERNAL FIXATION (ORIF) ANKLE FRACTURE;  Surgeon: Cammy Copa, MD;  Location: [facility];  Service: Orthopedics;  Laterality: Left;   Social History   Occupational History  . Not on file  Tobacco Use  . Smoking status: Former Smoker    Packs/day: 1.00    Years: 50.00    Pack years: 50.00    Last attempt to quit: 2000    Years since quitting: 19.7  . Smokeless  tobacco: Never Used  Substance and Sexual Activity  . Alcohol use: Never    Frequency: Never  . Drug use: Never  . Sexual activity: Not on file

## 2018-03-16 NOTE — Telephone Encounter (Signed)
[facility], no answer LM  to advise someone should be reaching out to arrange time for initial eval.

## 2018-03-18 ENCOUNTER — Telehealth (INDEPENDENT_AMBULATORY_CARE_PROVIDER_SITE_OTHER): Payer: Self-pay

## 2018-03-18 DIAGNOSIS — I10 Essential (primary) hypertension: Secondary | ICD-10-CM

## 2018-03-18 DIAGNOSIS — S82899E Other fracture of unspecified lower leg, subsequent encounter for open fracture type I or II with routine healing: Secondary | ICD-10-CM | POA: Diagnosis not present

## 2018-03-18 DIAGNOSIS — K5730 Diverticulosis of large intestine without perforation or abscess without bleeding: Secondary | ICD-10-CM

## 2018-03-18 DIAGNOSIS — J9611 Chronic respiratory failure with hypoxia: Secondary | ICD-10-CM

## 2018-03-18 DIAGNOSIS — K219 Gastro-esophageal reflux disease without esophagitis: Secondary | ICD-10-CM

## 2018-03-18 DIAGNOSIS — M1612 Unilateral primary osteoarthritis, left hip: Secondary | ICD-10-CM

## 2018-03-18 DIAGNOSIS — J449 Chronic obstructive pulmonary disease, unspecified: Secondary | ICD-10-CM

## 2018-03-18 DIAGNOSIS — M6258 Muscle wasting and atrophy, not elsewhere classified, other site: Secondary | ICD-10-CM

## 2018-03-18 NOTE — Telephone Encounter (Signed)
Flossie Buffy with [facility] called stating that they will start services for the patient on Saturday, 03/20/2018.  Thank you.

## 2018-03-23 ENCOUNTER — Telehealth (INDEPENDENT_AMBULATORY_CARE_PROVIDER_SITE_OTHER): Payer: Self-pay | Admitting: Orthopedic Surgery

## 2018-03-23 NOTE — Telephone Encounter (Signed)
GD pt  

## 2018-03-23 NOTE — Telephone Encounter (Signed)
Ashley Mora-(PT) from [facility] called advised [facility] is not open to HHPT for the patient. [name] advised [facility] is caring for the patient. The number to contact [name] is [phone]

## 2018-03-23 NOTE — Telephone Encounter (Signed)
IC and LM for Ashley Mora to call back to clarify. Per notes on 09/23 order was faxed to [facility]. I had spoken with Dorene Sorrow directly. Patients care has been transferred over to Dr Lajoyce Corners because he saw her at last OV with Dr August Saucer and will be following her care at this point because he put her in a VIVE compression sock.

## 2018-03-30 ENCOUNTER — Ambulatory Visit (INDEPENDENT_AMBULATORY_CARE_PROVIDER_SITE_OTHER): Payer: Medicare Other | Admitting: Orthopedic Surgery

## 2018-03-30 ENCOUNTER — Encounter (INDEPENDENT_AMBULATORY_CARE_PROVIDER_SITE_OTHER): Payer: Self-pay | Admitting: Orthopedic Surgery

## 2018-03-30 VITALS — Ht 62.0 in | Wt 195.0 lb

## 2018-03-30 DIAGNOSIS — S82892B Other fracture of left lower leg, initial encounter for open fracture type I or II: Secondary | ICD-10-CM

## 2018-03-30 DIAGNOSIS — I83023 Varicose veins of left lower extremity with ulcer of ankle: Secondary | ICD-10-CM

## 2018-03-30 DIAGNOSIS — L97321 Non-pressure chronic ulcer of left ankle limited to breakdown of skin: Secondary | ICD-10-CM

## 2018-03-30 NOTE — Progress Notes (Signed)
Office Visit Note   Patient: Ashley Mora           Date of Birth: 1933-05-27           MRN: [id number] Visit Date: 03/30/2018              Requested by: [name], MD No address on file PCP: [name], MD  Chief Complaint  Patient presents with  . Left Leg - Routine Post Op      HPI: Patient is a 84 year old woman with peripheral vascular disease and venous insufficiency status post open ankle fracture on the left.  Patient underwent open reduction internal fixation she is currently in compression stockings and a fracture boot partial weightbearing.  Assessment & Plan: Visit Diagnoses:  1. Open ankle fracture, left, type I or II, initial encounter   2. Venous stasis ulcer of left ankle limited to breakdown of skin with varicose veins (HCC)     Plan: Discussed the importance of continuing with the compression sock wear this 24 hours a day.  Patient may advance to weightbearing as tolerated with her fracture boot in place.  Obtain three-view radiographs of the left ankle at follow-up.  Follow-Up Instructions: Return in about 3 weeks (around 04/20/2018).   Ortho Exam  Patient is alert, oriented, no adenopathy, well-dressed, normal affect, normal respiratory effort. Examination the swelling is decreasing in the left leg.  There is a thin eschar over the traumatic open wound as well as the surgical incisions.  There is no redness no cellulitis no drainage no signs of infection.  Patient has had interval decrease in the edema in her leg no signs of cellulitis or infection.  Imaging: No results found. No images are attached to the encounter.  Labs: Lab Results  Component Value Date   HGBA1C 5.8 (H) 02/18/2018     No results found for: ALBUMIN, PREALBUMIN, LABURIC  Body mass index is 35.67 kg/m.  Orders:  No orders of the defined types were placed in this encounter.  No orders of the defined types were placed in this encounter.    Procedures: No  procedures performed  Clinical Data: No additional findings.  ROS:  All other systems negative, except as noted in the HPI. Review of Systems  Objective: Vital Signs: Ht 5\' 2"  (1.575 m)   Wt 195 lb (88.5 kg)   BMI 35.67 kg/m   Specialty Comments:  No specialty comments available.  PMFS History: Patient Active Problem List   Diagnosis Date Noted  . Acute blood loss as cause of postoperative anemia 03/02/2018  . Acute kidney injury (HCC) 03/02/2018  . Chronic respiratory failure with hypoxia (HCC) 03/02/2018  . GERD (gastroesophageal reflux disease) 03/02/2018  . B12 deficiency 03/02/2018  . Ankle fracture 02/20/2018  . Open ankle fracture, left, type I or II, with routine healing, subsequent encounter 02/18/2018  . COPD (chronic obstructive pulmonary disease) (HCC) 02/18/2018  . Borderline diabetes mellitus 02/18/2018  . Essential hypertension 02/18/2018  . Pasteurella cellulitis due to cat bite 02/18/2018  . Renal failure 02/18/2018  . Tibial fracture 02/18/2018   Past Medical History:  Diagnosis Date  . B12 deficiency   . Borderline diabetes   . COPD (chronic obstructive pulmonary disease) (HCC)    3L home O2  . HTN (hypertension)     Family History  Problem Relation Age of Onset  . Diabetes Mother     Past Surgical History:  Procedure Laterality Date  . ANKLE FRACTURE SURGERY Right   .  APPLICATION OF WOUND VAC Left 02/18/2018   Procedure: APPLICATION OF WOUND VAC;  Surgeon: Cammy Copa, MD;  Location: [facility] OR;  Service: Orthopedics;  Laterality: Left;  . EXTERNAL FIXATION LEG Left 02/18/2018   Procedure: EXTERNAL FIXATION LEG;  Surgeon: Cammy Copa, MD;  Location[facility] OR;  Service: Orthopedics;  Laterality: Left;  . EXTERNAL FIXATION REMOVAL Left 02/20/2018   Procedure: REMOVAL EXTERNAL FIXATION LEG;  Surgeon: Cammy Copa, MD;  Location: [facility] OR;  Service: Orthopedics;  Laterality: Left;  . I&D EXTREMITY Left 02/18/2018   Procedure:  IRRIGATION AND DEBRIDEMENT EXTREMITY;  Surgeon: Cammy Copa, MD;  Location: [facility] OR;  Service: Orthopedics;  Laterality: Left;  . I&D EXTREMITY Left 02/20/2018   Procedure: IRRIGATION AND DEBRIDEMENT LEG;  Surgeon: Cammy Copa, MD;  Location: [facility] OR;  Service: Orthopedics;  Laterality: Left;  . INCISION AND DRAINAGE OF WOUND Right 02/18/2018   Procedure: IRRIGATION AND DEBRIDEMENT WOUND;  Surgeon: Cammy Copa, MD;  Location: [facility] OR;  Service: Orthopedics;  Laterality: Right;  . ORIF ANKLE FRACTURE Left 02/20/2018   Procedure: OPEN REDUCTION INTERNAL FIXATION (ORIF) ANKLE FRACTURE;  Surgeon: Cammy Copa, MD;  Location: [facility] OR;  Service: Orthopedics;  Laterality: Left;   Social History   Occupational History  . Not on file  Tobacco Use  . Smoking status: Former Smoker    Packs/day: 1.00    Years: 50.00    Pack years: 50.00    Last attempt to quit: 2000    Years since quitting: 19.7  . Smokeless tobacco: Never Used  Substance and Sexual Activity  . Alcohol use: Never    Frequency: Never  . Drug use: Never  . Sexual activity: Not on file

## 2018-04-07 ENCOUNTER — Telehealth (INDEPENDENT_AMBULATORY_CARE_PROVIDER_SITE_OTHER): Payer: Self-pay

## 2018-04-07 NOTE — Telephone Encounter (Signed)
Ashley Mora with [facility] would like clarification on WB status for left ankle for patient.  CB# is [phone].  Please advise.  Thank You.

## 2018-04-07 NOTE — Telephone Encounter (Signed)
I called and lm on vm to advise that pt is to wear compression sock 24 hr everyday and weight bearing as tolerated with fx boot in place as of office visit 03/30/18. To call with questions.

## 2018-04-21 ENCOUNTER — Encounter (INDEPENDENT_AMBULATORY_CARE_PROVIDER_SITE_OTHER): Payer: Self-pay | Admitting: Orthopedic Surgery

## 2018-04-21 ENCOUNTER — Ambulatory Visit (INDEPENDENT_AMBULATORY_CARE_PROVIDER_SITE_OTHER): Payer: Medicare Other | Admitting: Family

## 2018-04-21 ENCOUNTER — Ambulatory Visit (INDEPENDENT_AMBULATORY_CARE_PROVIDER_SITE_OTHER): Payer: Self-pay

## 2018-04-21 VITALS — Ht 62.0 in | Wt 195.0 lb

## 2018-04-21 DIAGNOSIS — S82892B Other fracture of left lower leg, initial encounter for open fracture type I or II: Secondary | ICD-10-CM

## 2018-04-21 NOTE — Progress Notes (Signed)
Office Visit Note   Patient: Ashley Mora           Date of Birth: [birth year]           MRN: [id number] Visit Date: 04/21/2018              Requested by: [name], MD No address on file PCP: [name], MD  Chief Complaint  Patient presents with  . Left Ankle - Pain      HPI: Patient is a 84 year old woman with peripheral vascular disease and venous insufficiency. Is status post open ankle fracture on the left on 02/26/18.  Patient underwent open reduction internal fixation she is currently in compression stockings and a fracture boot partial weightbearing. In wheel chair today.  Assessment & Plan: Visit Diagnoses:  1. Open ankle fracture, left, type I or II, initial encounter     Plan: Discussed the importance of continuing with the compression sock with direct skin contact. wear this 24 hours a day.  Patient may advance to weightbearing as tolerated in regular shoe wear. Is to follow up in office with [place] in 3 weeks for wound check.   Follow-Up Instructions: No follow-ups on file.   Ortho Exam  Patient is alert, oriented, no adenopathy, well-dressed, normal affect, normal respiratory effort. Examination the swelling is decreasing in the left leg.  There is a thin eschar over anteriomedial ankle. The surgical incisions have healed.  There is no redness no cellulitis no drainage no signs of infection.   no signs of cellulitis or infection.  Imaging: No results found. No images are attached to the encounter.  Labs: Lab Results  Component Value Date   HGBA1C 5.8 (H) 02/18/2018     No results found for: ALBUMIN, PREALBUMIN, LABURIC  Body mass index is 35.67 kg/m.  Orders:  Orders Placed This Encounter  Procedures  . XR Ankle Complete Left   No orders of the defined types were placed in this encounter.    Procedures: No procedures performed  Clinical Data: No additional findings.  ROS:  All other systems negative, except as noted in the  HPI. Review of Systems  Cardiovascular: Negative for leg swelling.  Skin: Positive for wound.    Objective: Vital Signs: Ht 5\' 2"  (1.575 m)   Wt 195 lb (88.5 kg)   BMI 35.67 kg/m   Specialty Comments:  No specialty comments available.  PMFS History: Patient Active Problem List   Diagnosis Date Noted  . Acute blood loss as cause of postoperative anemia 03/02/2018  . Acute kidney injury (HCC) 03/02/2018  . Chronic respiratory failure with hypoxia (HCC) 03/02/2018  . GERD (gastroesophageal reflux disease) 03/02/2018  . B12 deficiency 03/02/2018  . Ankle fracture 02/20/2018  . Open ankle fracture, left, type I or II, with routine healing, subsequent encounter 02/18/2018  . COPD (chronic obstructive pulmonary disease) (HCC) 02/18/2018  . Borderline diabetes mellitus 02/18/2018  . Essential hypertension 02/18/2018  . Pasteurella cellulitis due to cat bite 02/18/2018  . Renal failure 02/18/2018  . Tibial fracture 02/18/2018   Past Medical History:  Diagnosis Date  . B12 deficiency   . Borderline diabetes   . COPD (chronic obstructive pulmonary disease) (HCC)    3L home O2  . HTN (hypertension)     Family History  Problem Relation Age of Onset  . Diabetes Mother     Past Surgical History:  Procedure Laterality Date  . ANKLE FRACTURE SURGERY Right   . APPLICATION OF WOUND VAC Left 02/18/2018  Procedure: APPLICATION OF WOUND VAC;  Surgeon: Cammy Copa, MD;  Location: [facility] OR;  Service: Orthopedics;  Laterality: Left;  . EXTERNAL FIXATION LEG Left 02/18/2018   Procedure: EXTERNAL FIXATION LEG;  Surgeon: Cammy Copa, MD;  Location: [facility] OR;  Service: Orthopedics;  Laterality: Left;  . EXTERNAL FIXATION REMOVAL Left 02/20/2018   Procedure: REMOVAL EXTERNAL FIXATION LEG;  Surgeon: Cammy Copa, MD;  Location: [facility] OR;  Service: Orthopedics;  Laterality: Left;  . I&D EXTREMITY Left 02/18/2018   Procedure: IRRIGATION AND DEBRIDEMENT EXTREMITY;  Surgeon: Cammy Copa, MD;  Location: [facility] OR;  Service: Orthopedics;  Laterality: Left;  . I&D EXTREMITY Left 02/20/2018   Procedure: IRRIGATION AND DEBRIDEMENT LEG;  Surgeon: Cammy Copa, MD;  Location: [facility] OR;  Service: Orthopedics;  Laterality: Left;  . INCISION AND DRAINAGE OF WOUND Right 02/18/2018   Procedure: IRRIGATION AND DEBRIDEMENT WOUND;  Surgeon: Cammy Copa, MD;  Location: [facility] OR;  Service: Orthopedics;  Laterality: Right;  . ORIF ANKLE FRACTURE Left 02/20/2018   Procedure: OPEN REDUCTION INTERNAL FIXATION (ORIF) ANKLE FRACTURE;  Surgeon: Cammy Copa, MD;  Location: [facility] OR;  Service: Orthopedics;  Laterality: Left;   Social History   Occupational History  . Not on file  Tobacco Use  . Smoking status: Former Smoker    Packs/day: 1.00    Years: 50.00    Pack years: 50.00    Last attempt to quit: 2000    Years since quitting: 19.8  . Smokeless tobacco: Never Used  Substance and Sexual Activity  . Alcohol use: Never    Frequency: Never  . Drug use: Never  . Sexual activity: Not on file

## 2018-04-26 ENCOUNTER — Telehealth (INDEPENDENT_AMBULATORY_CARE_PROVIDER_SITE_OTHER): Payer: Self-pay

## 2018-04-26 NOTE — Telephone Encounter (Signed)
Submitted VOB for SynviscOne, bilateral knee. 

## 2018-05-06 ENCOUNTER — Telehealth (INDEPENDENT_AMBULATORY_CARE_PROVIDER_SITE_OTHER): Payer: Self-pay

## 2018-05-06 NOTE — Telephone Encounter (Signed)
Patient is approved for SynviscOne, bilateral knee. Buy & Bill Patient will be responsible for 20% OOP. $30.00 Co-pay No PA required  Appt.05/12/2018 with Dr. Lajoyce Cornersuda

## 2018-05-07 ENCOUNTER — Telehealth (INDEPENDENT_AMBULATORY_CARE_PROVIDER_SITE_OTHER): Payer: Self-pay

## 2018-05-07 NOTE — Telephone Encounter (Signed)
I left vm for patient to call office and schedule an appt to come in on Monday instead of Wednesday for new changes per Marchelle FolksAmanda at [facility]. Nurse stated that patient has swelling and redness in her leg; possible cellulitis. Recommended an ABX over phone but I tried to call and get her in on 05/07/18 but no response.

## 2018-05-12 ENCOUNTER — Encounter (INDEPENDENT_AMBULATORY_CARE_PROVIDER_SITE_OTHER): Payer: Self-pay | Admitting: Family

## 2018-05-12 ENCOUNTER — Ambulatory Visit (INDEPENDENT_AMBULATORY_CARE_PROVIDER_SITE_OTHER): Payer: Medicare Other | Admitting: Family

## 2018-05-12 ENCOUNTER — Ambulatory Visit (INDEPENDENT_AMBULATORY_CARE_PROVIDER_SITE_OTHER): Payer: Medicare Other | Admitting: Orthopedic Surgery

## 2018-05-12 VITALS — Ht 62.0 in | Wt 195.0 lb

## 2018-05-12 DIAGNOSIS — M1712 Unilateral primary osteoarthritis, left knee: Secondary | ICD-10-CM | POA: Diagnosis not present

## 2018-05-12 DIAGNOSIS — S82892E Other fracture of left lower leg, subsequent encounter for open fracture type I or II with routine healing: Secondary | ICD-10-CM

## 2018-05-12 DIAGNOSIS — M1711 Unilateral primary osteoarthritis, right knee: Secondary | ICD-10-CM

## 2018-05-12 MED ORDER — HYLAN G-F 20 48 MG/6ML IX SOSY
48.0000 mg | PREFILLED_SYRINGE | INTRA_ARTICULAR | Status: AC | PRN
  Administered 2018-05-12: 48 mg via INTRA_ARTICULAR

## 2018-05-12 MED ORDER — LIDOCAINE HCL 1 % IJ SOLN
1.0000 mL | INTRAMUSCULAR | Status: AC | PRN
  Administered 2018-05-12: 1 mL

## 2018-05-12 NOTE — Progress Notes (Signed)
Office Visit Note   Patient: Ashley QuailsVirginia Saban           Date of Birth: 1933/04/23           MRN: [id number] Visit Date: 05/12/2018              Requested by: Patria ManeGassemi, Mike, MD No address on file PCP: Patria ManeGassemi, Mike, MD  Chief Complaint  Patient presents with  . Right Leg - Pain  . Left Leg - Pain  . Left Ankle - Follow-up      HPI: The patient is an 84 year old woman seen today for bilateral knee synvisc injection.   Is also following for ulceration to left ankle following open ankle fracture. Is treating with Vive compression stocking with direct skin contact. Complains that this feels more and more painful and tight over the course of the day. Has been mostly seated due to knee pain. Unable to progress with ambulation following ankle fracture due to knee pain.  Assessment & Plan: Visit Diagnoses:  1. Open ankle fracture, left, type I or II, with routine healing, subsequent encounter   2. Primary osteoarthritis of right knee   3. Primary osteoarthritis of left knee     Plan: continue with current wound care. Reassurance provided re compression stocking sizing and treatment. Follow up in 3 weeks.   Bilateral synvisc 1 injections.   Follow-Up Instructions: Return in about 3 weeks (around 06/02/2018) for wound check.   Ortho Exam  Pati ent is alert, oriented, no adenopathy, well-dressed, normal affect, normal respiratory effort. Examination the swelling is decreasing in the left leg.  There is a thin fibrinous tissue over anteriomedial ankle. This is decreased in size. Is 2 cm x 1 cm now. No drainage. Debrided with gauze. The surgical incisions have healed.  There is no redness no cellulitis no drainage no signs of infection.   no signs of cellulitis or infection.   Imaging: No results found. No images are attached to the encounter.  Labs: Lab Results  Component Value Date   HGBA1C 5.8 (H) 02/18/2018     No results found for: ALBUMIN, PREALBUMIN, LABURIC  Body  mass index is 35.67 kg/m.  Orders:  No orders of the defined types were placed in this encounter.  No orders of the defined types were placed in this encounter.    Procedures: Large Joint Inj: bilateral knee on 05/12/2018 4:16 PM Indications: pain Details: 18 G 1.5 in needle, anteromedial approach Medications (Right): 1 mL lidocaine 1 %; 48 mg Hylan 48 MG/6ML Medications (Left): 1 mL lidocaine 1 %; 48 mg Hylan 48 MG/6ML Consent was given by the patient.      Clinical Data: No additional findings.  ROS:  All other systems negative, except as noted in the HPI. Review of Systems  Constitutional: Negative for chills and fever.  Musculoskeletal: Positive for arthralgias.  Skin: Positive for wound. Negative for color change.    Objective: Vital Signs: Ht 5\' 2"  (1.575 m)   Wt 195 lb (88.5 kg)   BMI 35.67 kg/m   Specialty Comments:  No specialty comments available.  PMFS History: Patient Active Problem List   Diagnosis Date Noted  . Acute blood loss as cause of postoperative anemia 03/02/2018  . Acute kidney injury (HCC) 03/02/2018  . Chronic respiratory failure with hypoxia (HCC) 03/02/2018  . GERD (gastroesophageal reflux disease) 03/02/2018  . B12 deficiency 03/02/2018  . Ankle fracture 02/20/2018  . Open ankle fracture, left, type I or II,  with routine healing, subsequent encounter 02/18/2018  . COPD (chronic obstructive pulmonary disease) (HCC) 02/18/2018  . Borderline diabetes mellitus 02/18/2018  . Essential hypertension 02/18/2018  . Pasteurella cellulitis due to cat bite 02/18/2018  . Renal failure 02/18/2018  . Tibial fracture 02/18/2018   Past Medical History:  Diagnosis Date  . B12 deficiency   . Borderline diabetes   . COPD (chronic obstructive pulmonary disease) (HCC)    3L home O2  . HTN (hypertension)     Family History  Problem Relation Age of Onset  . Diabetes Mother     Past Surgical History:  Procedure Laterality Date  . ANKLE  FRACTURE SURGERY Right   . APPLICATION OF WOUND VAC Left 02/18/2018   Procedure: APPLICATION OF WOUND VAC;  Surgeon: Cammy Copa, MD;  Location: [facility] OR;  Service: Orthopedics;  Laterality: Left;  . EXTERNAL FIXATION LEG Left 02/18/2018   Procedure: EXTERNAL FIXATION LEG;  Surgeon: Cammy Copa, MD;  Location: [facility] OR;  Service: Orthopedics;  Laterality: Left;  . EXTERNAL FIXATION REMOVAL Left 02/20/2018   Procedure: REMOVAL EXTERNAL FIXATION LEG;  Surgeon: Cammy Copa, MD;  Location: [facility] OR;  Service: Orthopedics;  Laterality: Left;  . I&D EXTREMITY Left 02/18/2018   Procedure: IRRIGATION AND DEBRIDEMENT EXTREMITY;  Surgeon: Cammy Copa, MD;  Location: [facility] OR;  Service: Orthopedics;  Laterality: Left;  . I&D EXTREMITY Left 02/20/2018   Procedure: IRRIGATION AND DEBRIDEMENT LEG;  Surgeon: Cammy Copa, MD;  Location: [facility] OR;  Service: Orthopedics;  Laterality: Left;  . INCISION AND DRAINAGE OF WOUND Right 02/18/2018   Procedure: IRRIGATION AND DEBRIDEMENT WOUND;  Surgeon: Cammy Copa, MD;  Location: [facility] OR;  Service: Orthopedics;  Laterality: Right;  . ORIF ANKLE FRACTURE Left 02/20/2018   Procedure: OPEN REDUCTION INTERNAL FIXATION (ORIF) ANKLE FRACTURE;  Surgeon: Cammy Copa, MD;  Location: [facility] OR;  Service: Orthopedics;  Laterality: Left;   Social History   Occupational History  . Not on file  Tobacco Use  . Smoking status: Former Smoker    Packs/day: 1.00    Years: 50.00    Pack years: 50.00    Last attempt to quit: 2000    Years since quitting: 19.8  . Smokeless tobacco: Never Used  Substance and Sexual Activity  . Alcohol use: Never    Frequency: Never  . Drug use: Never  . Sexual activity: Not on file

## 2018-06-02 ENCOUNTER — Ambulatory Visit (INDEPENDENT_AMBULATORY_CARE_PROVIDER_SITE_OTHER): Payer: Medicare Other | Admitting: Family

## 2018-06-02 ENCOUNTER — Encounter (INDEPENDENT_AMBULATORY_CARE_PROVIDER_SITE_OTHER): Payer: Self-pay | Admitting: Family

## 2018-06-02 VITALS — Ht 62.0 in | Wt 195.0 lb

## 2018-06-02 DIAGNOSIS — S82892E Other fracture of left lower leg, subsequent encounter for open fracture type I or II with routine healing: Secondary | ICD-10-CM | POA: Diagnosis not present

## 2018-06-02 DIAGNOSIS — L97311 Non-pressure chronic ulcer of right ankle limited to breakdown of skin: Secondary | ICD-10-CM | POA: Diagnosis not present

## 2018-06-02 NOTE — Progress Notes (Signed)
Office Visit Note   Patient: Ashley QuailsVirginia Mora           Date of Birth: July 11, 1932           MRN: [id number] Visit Date: 06/02/2018              Requested by: Patria ManeGassemi, Mike, MD No address on file PCP: Patria ManeGassemi, Mike, MD  Chief Complaint  Patient presents with  . Right Knee - Follow-up    S/p bilateral knee synvisc injections  . Left Knee - Follow-up  . Left Ankle - Follow-up    Ankle ulcer       HPI: The patient is an 84 year old woman seen today in follow up for ulceration to left ankle following open ankle fracture. Is treating with Vive compression stocking with direct skin contact. Complains of thick white drainage and swelling to knee above the compression hose. Doing some progressive ambulation. Daughter proud of patient for walking from house to car today. Left knee pain limiting ambulation progress.  Assessment & Plan: Visit Diagnoses:  1. Open ankle fracture, left, type I or II, with routine healing, subsequent encounter   2. 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 Reassurance provided re compression stocking sizing and treatment. Follow up in 4 weeks.   Bilateral synvisc 1 injections.   Follow-Up Instructions: Return in about 4 weeks (around 06/30/2018).   Ortho Exam  Pati ent is alert, oriented, no adenopathy, well-dressed, normal affect, normal respiratory effort. Examination the swelling is decreased in the left leg.  There is a thin fibrinous tissue an area of 4 x 4mm this was debrided. This is decreased in size. Is 7 mm x 6 mm. No drainage. The surgical incisions have healed.  There is no redness no cellulitis no drainage no signs of infection.    Imaging: No results found. No images are attached to the encounter.  Labs: Lab Results  Component Value Date   HGBA1C 5.8 (H) 02/18/2018     No results found for:  ALBUMIN, PREALBUMIN, LABURIC  Body mass index is 35.67 kg/m.  Orders:  No orders of the defined types were placed in this encounter.  No orders of the defined types were placed in this encounter.    Procedures: No procedures performed  Clinical Data: No additional findings.  ROS:  All other systems negative, except as noted in the HPI. Review of Systems  Constitutional: Negative for chills and fever.  Musculoskeletal: Positive for arthralgias.  Skin: Positive for wound. Negative for color change.    Objective: Vital Signs: Ht 5\' 2"  (1.575 m)   Wt 195 lb (88.5 kg)   BMI 35.67 kg/m   Specialty Comments:  No specialty comments available.  PMFS History: Patient Active Problem List   Diagnosis Date Noted  . Acute blood loss as cause of postoperative anemia 03/02/2018  . Acute kidney injury (HCC) 03/02/2018  . Chronic respiratory failure with hypoxia (HCC) 03/02/2018  . GERD (gastroesophageal reflux disease) 03/02/2018  . B12 deficiency 03/02/2018  . Ankle fracture 02/20/2018  . Open ankle fracture, left, type I or II, with routine healing, subsequent encounter 02/18/2018  . COPD (chronic obstructive pulmonary disease) (HCC) 02/18/2018  . Borderline diabetes mellitus 02/18/2018  . Essential hypertension 02/18/2018  . Pasteurella cellulitis due to cat bite 02/18/2018  . Renal failure 02/18/2018  . Tibial fracture  02/18/2018   Past Medical History:  Diagnosis Date  . B12 deficiency   . Borderline diabetes   . COPD (chronic obstructive pulmonary disease) (HCC)    3L home O2  . HTN (hypertension)     Family History  Problem Relation Age of Onset  . Diabetes Mother     Past Surgical History:  Procedure Laterality Date  . ANKLE FRACTURE SURGERY Right   . APPLICATION OF WOUND VAC Left 02/18/2018   Procedure: APPLICATION OF WOUND VAC;  Surgeon: Cammy Copa, MD;  Location: [facility] OR;  Service: Orthopedics;  Laterality: Left;  . EXTERNAL FIXATION LEG Left  02/18/2018   Procedure: EXTERNAL FIXATION LEG;  Surgeon: Cammy Copa, MD;  Location: [facility] OR;  Service: Orthopedics;  Laterality: Left;  . EXTERNAL FIXATION REMOVAL Left 02/20/2018   Procedure: REMOVAL EXTERNAL FIXATION LEG;  Surgeon: Cammy Copa, MD;  Location: [facility] OR;  Service: Orthopedics;  Laterality: Left;  . I&D EXTREMITY Left 02/18/2018   Procedure: IRRIGATION AND DEBRIDEMENT EXTREMITY;  Surgeon: Cammy Copa, MD;  Location: [facility] OR;  Service: Orthopedics;  Laterality: Left;  . I&D EXTREMITY Left 02/20/2018   Procedure: IRRIGATION AND DEBRIDEMENT LEG;  Surgeon: Cammy Copa, MD;  Location: [facility] OR;  Service: Orthopedics;  Laterality: Left;  . INCISION AND DRAINAGE OF WOUND Right 02/18/2018   Procedure: IRRIGATION AND DEBRIDEMENT WOUND;  Surgeon: Cammy Copa, MD;  Location: [facility] OR;  Service: Orthopedics;  Laterality: Right;  . ORIF ANKLE FRACTURE Left 02/20/2018   Procedure: OPEN REDUCTION INTERNAL FIXATION (ORIF) ANKLE FRACTURE;  Surgeon: Cammy Copa, MD;  Location: [facility] OR;  Service: Orthopedics;  Laterality: Left;   Social History   Occupational History  . Not on file  Tobacco Use  . Smoking status: Former Smoker    Packs/day: 1.00    Years: 50.00    Pack years: 50.00    Last attempt to quit: 2000    Years since quitting: 19.9  . Smokeless tobacco: Never Used  Substance and Sexual Activity  . Alcohol use: Never    Frequency: Never  . Drug use: Never  . Sexual activity: Not on file

## 2018-07-01 ENCOUNTER — Ambulatory Visit (INDEPENDENT_AMBULATORY_CARE_PROVIDER_SITE_OTHER): Payer: Medicare Other | Admitting: Orthopedic Surgery

## 2018-07-01 ENCOUNTER — Encounter (INDEPENDENT_AMBULATORY_CARE_PROVIDER_SITE_OTHER): Payer: Self-pay | Admitting: Orthopedic Surgery

## 2018-07-01 VITALS — Ht 62.0 in | Wt 195.0 lb

## 2018-07-01 DIAGNOSIS — L97321 Non-pressure chronic ulcer of left ankle limited to breakdown of skin: Secondary | ICD-10-CM

## 2018-07-01 DIAGNOSIS — S82892E Other fracture of left lower leg, subsequent encounter for open fracture type I or II with routine healing: Secondary | ICD-10-CM

## 2018-07-01 DIAGNOSIS — I83023 Varicose veins of left lower extremity with ulcer of ankle: Secondary | ICD-10-CM

## 2018-07-01 NOTE — Progress Notes (Signed)
Office Visit Note   Patient: Ashley Mora           Date of Birth: 1932/07/06           MRN: [id number] Visit Date: 07/01/2018              Requested by: Patria ManeGassemi, Mike, MD No address on file PCP: Patria ManeGassemi, Mike, MD  Chief Complaint  Patient presents with  . Left Leg - Follow-up  . Right Leg - Follow-up  . Left Ankle - Follow-up      HPI: Patient presents in follow-up for traumatic wound medial aspect left ankle status post open reduction internal fixation for ankle open fracture dislocation.  Patient most recently was placed in a medical compression stocking she states it was uncomfortable at the proximal aspect the stockings she stopped wearing it would use her original compression stockings.  With wearing her regular compression stockings the ulcer has gotten worse there is hyper granulation tissue.  Assessment & Plan: Visit Diagnoses:  1. Open ankle fracture, left, type I or II, with routine healing, subsequent encounter   2. Venous stasis ulcer of left ankle limited to breakdown of skin with varicose veins (HCC)     Plan: We will place her in a Dynaflex wrap with silver cell over the wounds.  She will bring her socks with her at follow-up and will show proper application of the sock.  With wearing the medical compression sock we can extend the patient's follow-up visit she is quite concerned about coming back to the office with difficulty getting a ride and the expense of the co-pay.  Follow-Up Instructions: Return in about 1 week (around 07/08/2018).   Ortho Exam  Patient is alert, oriented, no adenopathy, well-dressed, normal affect, normal respiratory effort. On examination patient has 2 wounds over the medial left ankle the incision has healed nicely the traumatic wound is healed of these 2 ulcers patient has pitting edema with venous insufficiency there is no cellulitis no odor no drainage no signs of infection.  One wound is 2 cm in diameter 0.1 mm deep with 1/2%  beefy granulation tissue the second wound is about 1 cm diameter with hyper granulation tissue this was touched with silver nitrate.  Imaging: No results found. No images are attached to the encounter.  Labs: Lab Results  Component Value Date   HGBA1C 5.8 (H) 02/18/2018     No results found for: ALBUMIN, PREALBUMIN, LABURIC  Body mass index is 35.67 kg/m.  Orders:  No orders of the defined types were placed in this encounter.  No orders of the defined types were placed in this encounter.    Procedures: No procedures performed  Clinical Data: No additional findings.  ROS:  All other systems negative, except as noted in the HPI. Review of Systems  Objective: Vital Signs: Ht 5\' 2"  (1.575 m)   Wt 195 lb (88.5 kg)   BMI 35.67 kg/m   Specialty Comments:  No specialty comments available.  PMFS History: Patient Active Problem List   Diagnosis Date Noted  . Acute blood loss as cause of postoperative anemia 03/02/2018  . Acute kidney injury (HCC) 03/02/2018  . Chronic respiratory failure with hypoxia (HCC) 03/02/2018  . GERD (gastroesophageal reflux disease) 03/02/2018  . B12 deficiency 03/02/2018  . Ankle fracture 02/20/2018  . Open ankle fracture, left, type I or II, with routine healing, subsequent encounter 02/18/2018  . COPD (chronic obstructive pulmonary disease) (HCC) 02/18/2018  . Borderline diabetes mellitus 02/18/2018  .  Essential hypertension 02/18/2018  . Pasteurella cellulitis due to cat bite 02/18/2018  . Renal failure 02/18/2018  . Tibial fracture 02/18/2018   Past Medical History:  Diagnosis Date  . B12 deficiency   . Borderline diabetes   . COPD (chronic obstructive pulmonary disease) (HCC)    3L home O2  . HTN (hypertension)     Family History  Problem Relation Age of Onset  . Diabetes Mother     Past Surgical History:  Procedure Laterality Date  . ANKLE FRACTURE SURGERY Right   . APPLICATION OF WOUND VAC Left 02/18/2018   Procedure:  APPLICATION OF WOUND VAC;  Surgeon: Cammy Copa, MD;  Location: [facility] OR;  Service: Orthopedics;  Laterality: Left;  . EXTERNAL FIXATION LEG Left 02/18/2018   Procedure: EXTERNAL FIXATION LEG;  Surgeon: Cammy Copa, MD;  Location: [facility] OR;  Service: Orthopedics;  Laterality: Left;  . EXTERNAL FIXATION REMOVAL Left 02/20/2018   Procedure: REMOVAL EXTERNAL FIXATION LEG;  Surgeon: Cammy Copa, MD;  Location: [facility] OR;  Service: Orthopedics;  Laterality: Left;  . I&D EXTREMITY Left 02/18/2018   Procedure: IRRIGATION AND DEBRIDEMENT EXTREMITY;  Surgeon: Cammy Copa, MD;  Location: [facility] OR;  Service: Orthopedics;  Laterality: Left;  . I&D EXTREMITY Left 02/20/2018   Procedure: IRRIGATION AND DEBRIDEMENT LEG;  Surgeon: Cammy Copa, MD;  Location: [facility] OR;  Service: Orthopedics;  Laterality: Left;  . INCISION AND DRAINAGE OF WOUND Right 02/18/2018   Procedure: IRRIGATION AND DEBRIDEMENT WOUND;  Surgeon: Cammy Copa, MD;  Location: [facility] OR;  Service: Orthopedics;  Laterality: Right;  . ORIF ANKLE FRACTURE Left 02/20/2018   Procedure: OPEN REDUCTION INTERNAL FIXATION (ORIF) ANKLE FRACTURE;  Surgeon: Cammy Copa, MD;  Location: [facility] OR;  Service: Orthopedics;  Laterality: Left;   Social History   Occupational History  . Not on file  Tobacco Use  . Smoking status: Former Smoker    Packs/day: 1.00    Years: 50.00    Pack years: 50.00    Last attempt to quit: 2000    Years since quitting: 20.0  . Smokeless tobacco: Never Used  Substance and Sexual Activity  . Alcohol use: Never    Frequency: Never  . Drug use: Never  . Sexual activity: Not on file

## 2018-07-08 ENCOUNTER — Ambulatory Visit (INDEPENDENT_AMBULATORY_CARE_PROVIDER_SITE_OTHER): Payer: Medicare Other | Admitting: Orthopedic Surgery

## 2018-07-08 DIAGNOSIS — I83023 Varicose veins of left lower extremity with ulcer of ankle: Secondary | ICD-10-CM | POA: Diagnosis not present

## 2018-07-08 DIAGNOSIS — L97321 Non-pressure chronic ulcer of left ankle limited to breakdown of skin: Secondary | ICD-10-CM

## 2018-07-08 DIAGNOSIS — S82892E Other fracture of left lower leg, subsequent encounter for open fracture type I or II with routine healing: Secondary | ICD-10-CM

## 2018-07-09 ENCOUNTER — Encounter (INDEPENDENT_AMBULATORY_CARE_PROVIDER_SITE_OTHER): Payer: Self-pay | Admitting: Orthopedic Surgery

## 2018-07-09 NOTE — Progress Notes (Signed)
Office Visit Note   Patient: Ashley Mora           Date of Birth: [birth year]           MRN: [id number] Visit Date: 07/08/2018              Requested by: [name], MD No address on file PCP: [name], MD  Chief Complaint  Patient presents with  . Left Ankle - Follow-up      HPI: Patient is an 85 year old woman who presents in follow-up for venous ulcer left ankle status post open ankle fracture.  Assessment & Plan: Visit Diagnoses:  1. Venous stasis ulcer of left ankle limited to breakdown of skin with varicose veins (HCC)   2. Open ankle fracture, left, type I or II, with routine healing, subsequent encounter     Plan: Patient will start using the medical compression stocking size medium she will wear this around the clock she will elevate her foot.  Patient states she would like to follow-up in 4 weeks.  Follow-Up Instructions: Return in about 4 weeks (around 08/05/2018).   Ortho Exam  Patient is alert, oriented, no adenopathy, well-dressed, normal affect, normal respiratory effort. Examination there is decreased swelling from the Dynaflex wrap.  There is good wrinkling of the skin there is no redness no cellulitis no signs of infection patient has 2 small areas of hyper granulation tissue that are 5 mm in diameter 3 mm proud there is no drainage no cellulitis no signs of infection.  Imaging: No results found. No images are attached to the encounter.  Labs: Lab Results  Component Value Date   HGBA1C 5.8 (H) 02/18/2018     No results found for: ALBUMIN, PREALBUMIN, LABURIC  There is no height or weight on file to calculate BMI.  Orders:  No orders of the defined types were placed in this encounter.  No orders of the defined types were placed in this encounter.    Procedures: No procedures performed  Clinical Data: No additional findings.  ROS:  All other systems negative, except as noted in the HPI. Review of  Systems  Objective: Vital Signs: There were no vitals taken for this visit.  Specialty Comments:  No specialty comments available.  PMFS History: Patient Active Problem List   Diagnosis Date Noted  . Acute blood loss as cause of postoperative anemia 03/02/2018  . Acute kidney injury (HCC) 03/02/2018  . Chronic respiratory failure with hypoxia (HCC) 03/02/2018  . GERD (gastroesophageal reflux disease) 03/02/2018  . B12 deficiency 03/02/2018  . Ankle fracture 02/20/2018  . Open ankle fracture, left, type I or II, with routine healing, subsequent encounter 02/18/2018  . COPD (chronic obstructive pulmonary disease) (HCC) 02/18/2018  . Borderline diabetes mellitus 02/18/2018  . Essential hypertension 02/18/2018  . Pasteurella cellulitis due to cat bite 02/18/2018  . Renal failure 02/18/2018  . Tibial fracture 02/18/2018   Past Medical History:  Diagnosis Date  . B12 deficiency   . Borderline diabetes   . COPD (chronic obstructive pulmonary disease) (HCC)    3L home O2  . HTN (hypertension)     Family History  Problem Relation Age of Onset  . Diabetes Mother     Past Surgical History:  Procedure Laterality Date  . ANKLE FRACTURE SURGERY Right   . APPLICATION OF WOUND VAC Left 02/18/2018   Procedure: APPLICATION OF WOUND VAC;  Surgeon: Cammy Copa, MD;  Location: [facility] OR;  Service: Orthopedics;  Laterality: Left;  .  EXTERNAL FIXATION LEG Left 02/18/2018   Procedure: EXTERNAL FIXATION LEG;  Surgeon: Cammy Copa, MD;  Location: [facility] OR;  Service: Orthopedics;  Laterality: Left;  . EXTERNAL FIXATION REMOVAL Left 02/20/2018   Procedure: REMOVAL EXTERNAL FIXATION LEG;  Surgeon: Cammy Copa, MD;  Location: [facility] OR;  Service: Orthopedics;  Laterality: Left;  . I&D EXTREMITY Left 02/18/2018   Procedure: IRRIGATION AND DEBRIDEMENT EXTREMITY;  Surgeon: Cammy Copa, MD;  Location: [facility] OR;  Service: Orthopedics;  Laterality: Left;  . I&D EXTREMITY Left 02/20/2018    Procedure: IRRIGATION AND DEBRIDEMENT LEG;  Surgeon: Cammy Copa, MD;  Location: [facility] OR;  Service: Orthopedics;  Laterality: Left;  . INCISION AND DRAINAGE OF WOUND Right 02/18/2018   Procedure: IRRIGATION AND DEBRIDEMENT WOUND;  Surgeon: Cammy Copa, MD;  Location: [facility] OR;  Service: Orthopedics;  Laterality: Right;  . ORIF ANKLE FRACTURE Left 02/20/2018   Procedure: OPEN REDUCTION INTERNAL FIXATION (ORIF) ANKLE FRACTURE;  Surgeon: Cammy Copa, MD;  Location: [facility] OR;  Service: Orthopedics;  Laterality: Left;   Social History   Occupational History  . Not on file  Tobacco Use  . Smoking status: Former Smoker    Packs/day: 1.00    Years: 50.00    Pack years: 50.00    Last attempt to quit: 2000    Years since quitting: 20.0  . Smokeless tobacco: Never Used  Substance and Sexual Activity  . Alcohol use: Never    Frequency: Never  . Drug use: Never  . Sexual activity: Not on file

## 2018-08-05 ENCOUNTER — Ambulatory Visit (INDEPENDENT_AMBULATORY_CARE_PROVIDER_SITE_OTHER): Payer: Medicare Other | Admitting: Orthopedic Surgery

## 2020-06-23 DEATH — deceased

## 2020-08-12 IMAGING — DX DG ANKLE PORT 2V*L*
1 series · 2 of 2 positions shown · non-contrast
Comparison: None.

CLINICAL DATA: 85-year-old female with a history tib-fib fracture
after a fall

EXAM:
PORTABLE LEFT ANKLE - 2 VIEW

[Series 1: ankle · 0.14mm/px · 2 of 2 slices shown]
[im 1/2]
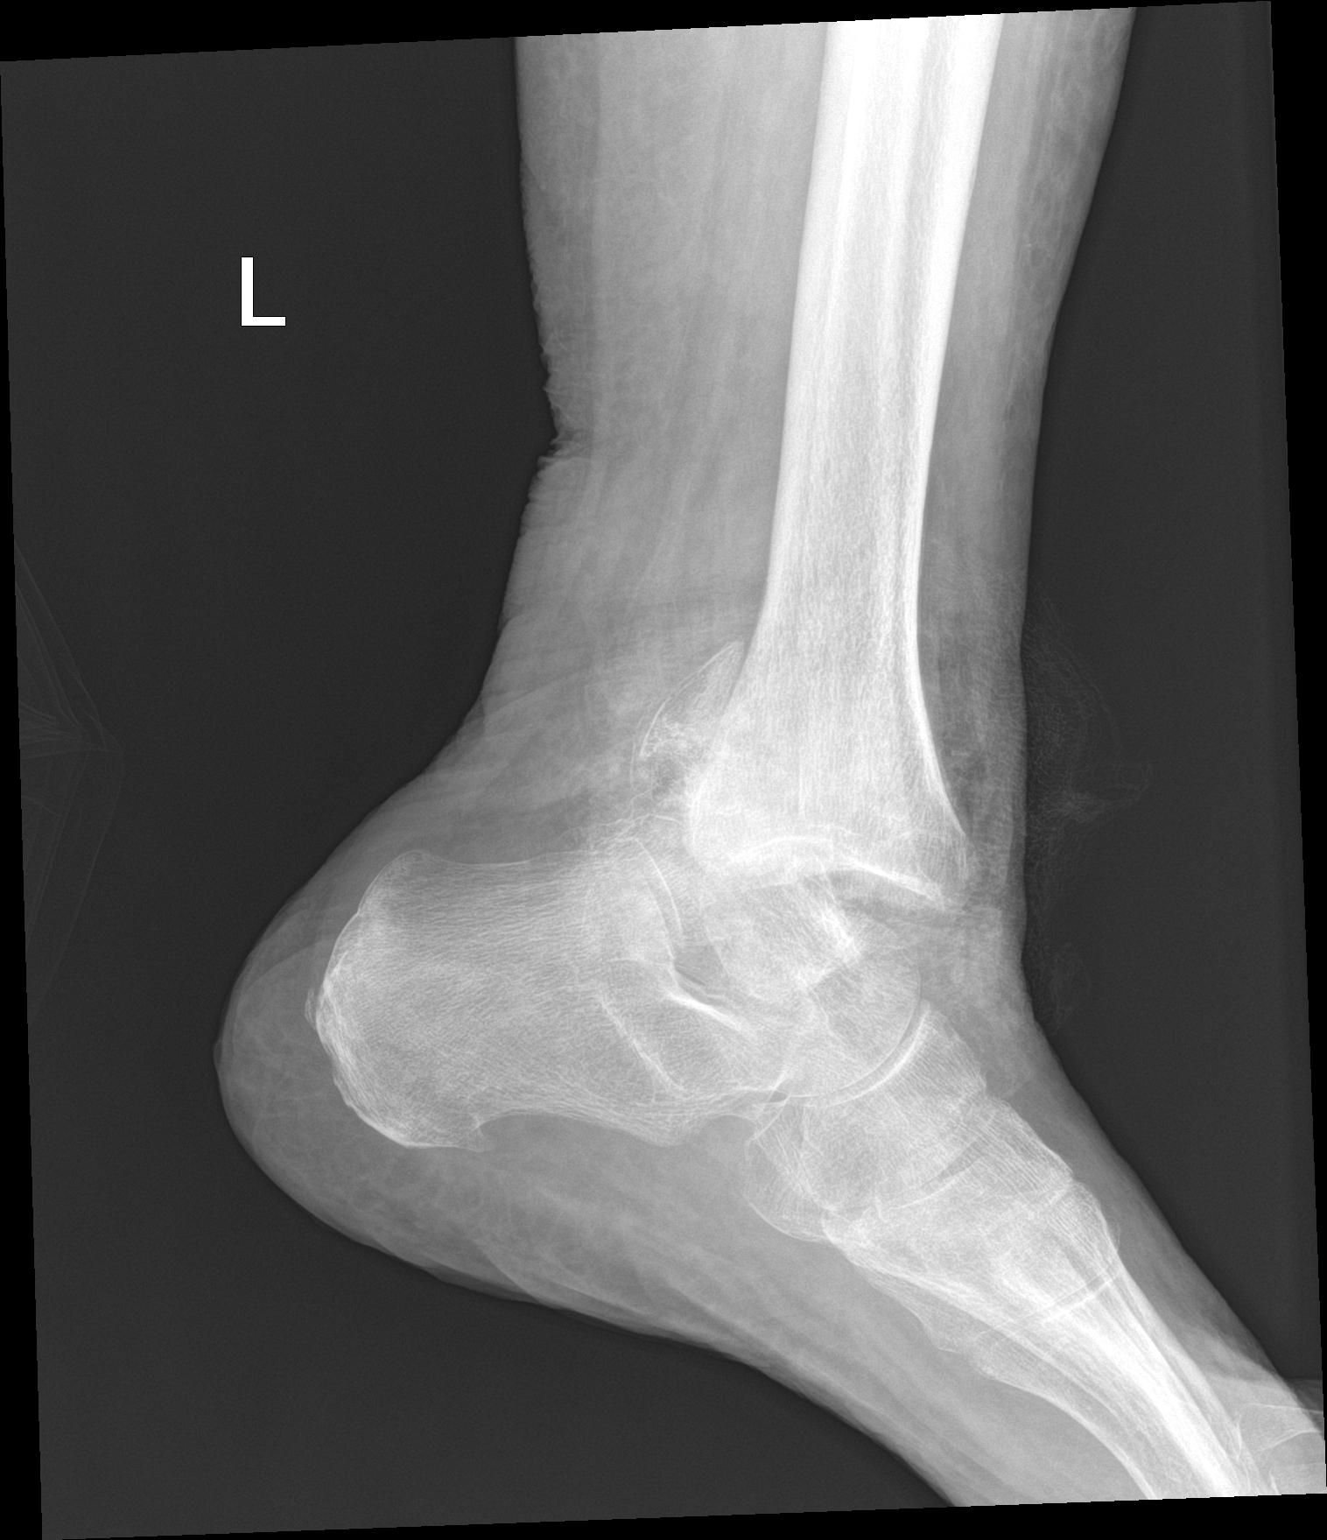
[im 2/2]
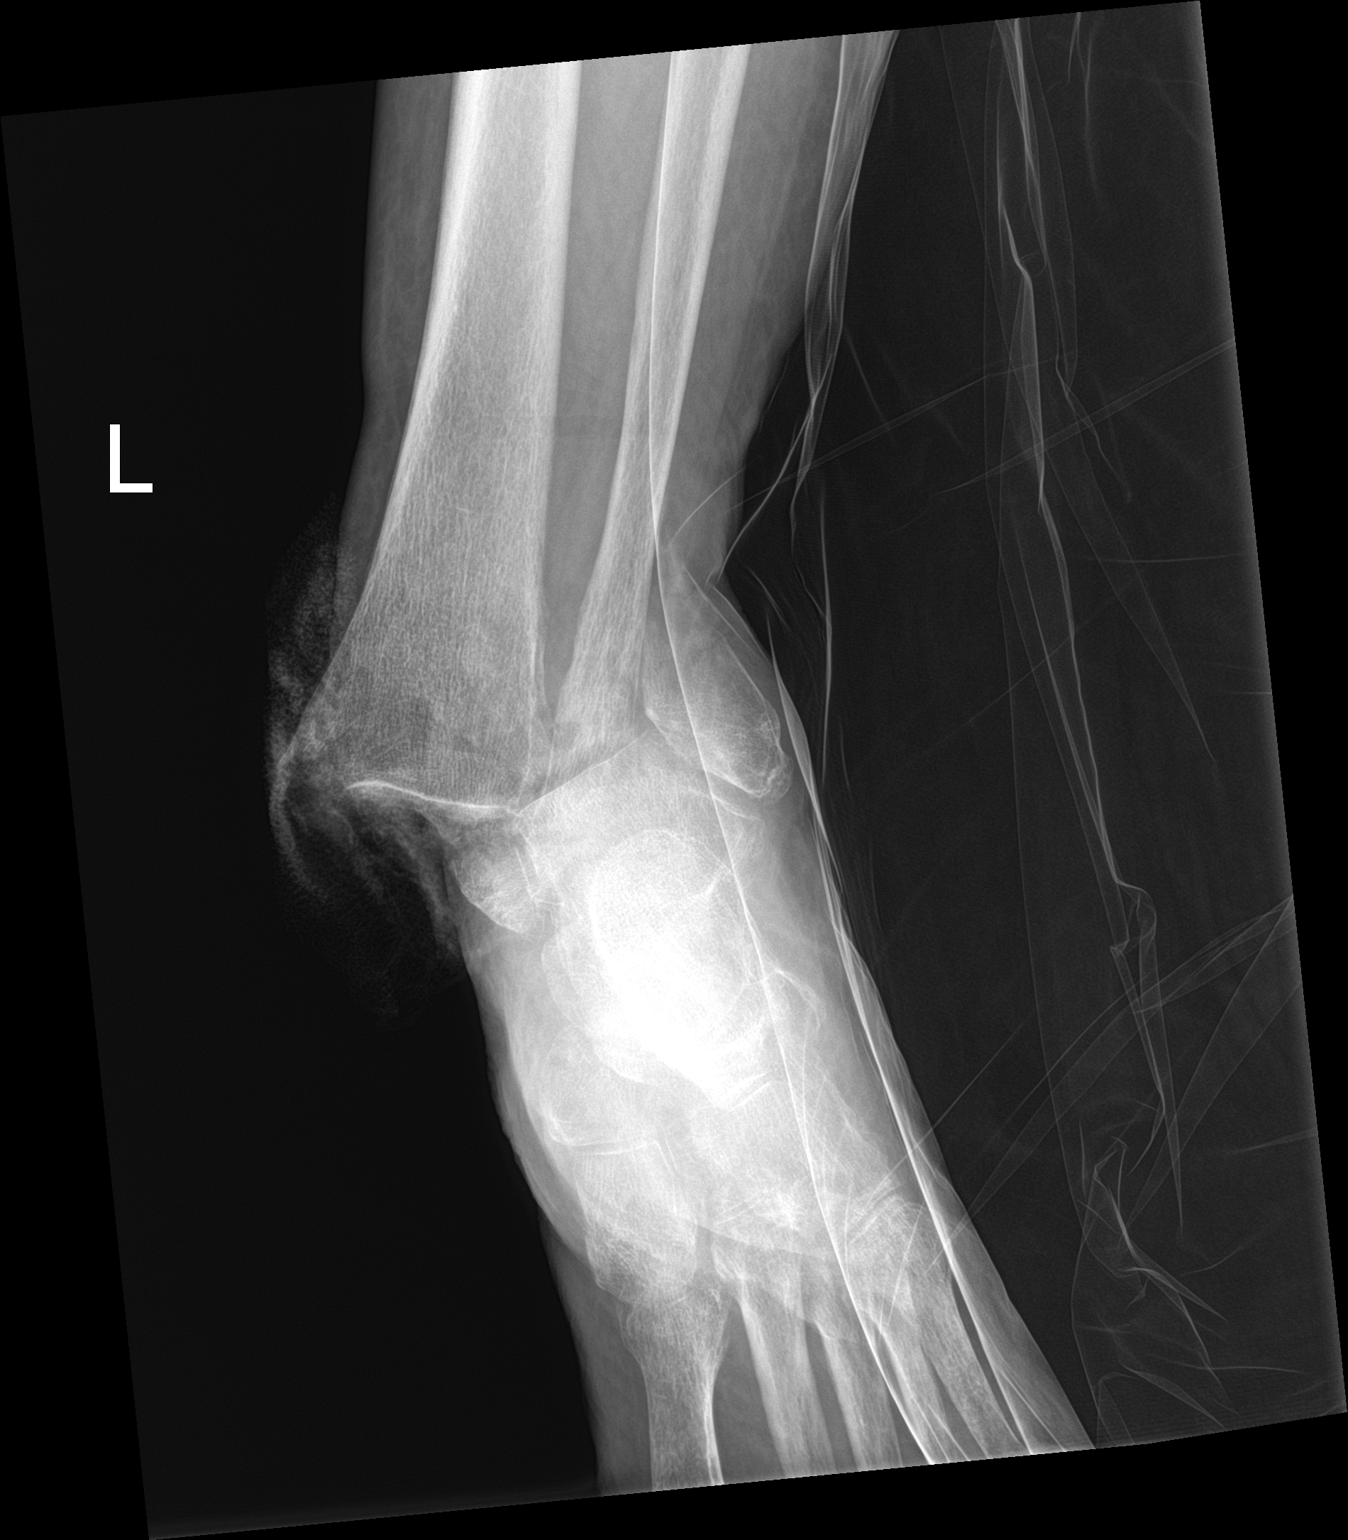

[2 of 2 positions shown; findings below may reference images not displayed]

FINDINGS: Osteopenia.

Lateral talar fracture dislocation with fracture of the medial
malleolus, lateral malleolus, and possible posterior malleolar
fracture on the lateral view. Soft tissue swelling and subcutaneous
gas.
IMPRESSION: Open talar fracture dislocation, with plain film evidence of
trimalleolar fracture and lateral talar dislocation.

Osteopenia.
